# Patient Record
Sex: Female | Born: 1985 | Race: Black or African American | Hispanic: No
Health system: Southern US, Community
[De-identification: ages and names within clinical notes are randomized; demographics above are authoritative.]

## PROBLEM LIST (undated history)

## (undated) DIAGNOSIS — Z6830 Body mass index (BMI) 30.0-30.9, adult: Secondary | ICD-10-CM

## (undated) DIAGNOSIS — Z5900 Homelessness unspecified: Secondary | ICD-10-CM

## (undated) HISTORY — PX: OTHER SURGICAL HISTORY: SHX169

---

## 2013-01-03 NOTE — ED Provider Notes (Signed)
[facility]  EMERGENCY DEPARTMENT TREATMENT REPORT  NAME:  Chen, Whitney  SEX:   F  ADMIT: 01/02/2013  DOB:   04/25/1985  MR#    [id number]  ROOM:    TIME DICTATED: 04 15 AM  ACCT#  [id number]        CHIEF COMPLAINT:  Flu-like illness.    HISTORY OF PRESENT ILLNESS:  A 28 year old female who arrived to the Emergency Department, states that for   the last couple of days she has been having a scratchy throat, fever, body   aches, mild cough.  No runny nose.  No ear pain.  Noticed that the right side   ear hurts, but her throat hurts as well.  She is able to swallow but is   irritating.  No drooling.  No change of her voice.    REVIEW OF SYSTEMS:  CONSTITUTIONAL:  Fever, sore throat.    RESPIRATORY:  No cough, shortness of breath, or wheezing.  CARDIOVASCULAR:  No chest pain, chest pressure, or palpitations.  GASTROINTESTINAL:  No vomiting, diarrhea, or abdominal pain.  GENITOURINARY:  No dysuria, frequency, or urgency.  MUSCULOSKELETAL:  No joint pain or swelling.  Body aches.  INTEGUMENTARY:  No rashes.  NEUROLOGIC:  No headaches.    PAST MEDICAL HISTORY:  Obesity, tobacco dependency.    FAMILY HISTORY:  Hypertension.    SOCIAL HISTORY:  Drinks and smokes.    ALLERGIES:  SEE IBEX.    MEDICATIONS:  See Ibex.    PHYSICAL EXAMINATION:  VITAL SIGNS:  131/70, 98, 18, 99, 101.6, on 0 to 10 pain scale 7 out of 10,   saturation 98%.  GENERAL APPEARANCE:  Patient appears well developed and well nourished.    Appearance and behavior are age and situation appropriate.    HEENT:  Normocephalic, atraumatic.  NECK:  Supple, nontender, symmetrical, no masses or JVD, trachea midline,   thyroid not enlarged, nodular or tender.  RESPIRATORY:  Clear and equal breath sounds.  No respiratory distress,   tachypnea, or accessory muscle use.    CARDIOVASCULAR:  Heart regular rate and rhythm without any rubs, murmurs,   gallops or thrills.  GASTROINTESTINAL:  Abdomen soft, nontender, without complaint of pain to    palpation.  No hepatomegaly or splenomegaly.  SKIN:  Warm and dry without rashes.  NEUROLOGIC:  Alert, oriented.  Sensation intact, motor strength equal and   symmetric.  No nuchal rigidity.  ENT:  Ears/Nose:  Hearing is grossly intact to voice.  Internal and external   examinations of the ears and nose are unremarkable.  :  Warm and dry without   rashes.  The tonsils are both erythematous, swollen with some exudate.  No   posterior lymphadenopathy palpable.  Does have bilateral cervical   lymphadenopathy palpable.  No trismus, no PTA, no uvular deviation.    ASSESSMENT AND MANAGEMENT PLAN:  This is a 28 year old female who comes in with what appears to be tonsillitis.    She has no peritonsillar abscess, no retropharyngeal abscess.  We will go   ahead and give her the first dose of penicillin and prednisone and send her   home with penicillin, prednisone, Motrin and  Norco.  I do not think that she   has the flu.  At this present time the patient is going home.    FINAL DIAGNOSIS:  Acute tonsillitis.    The patient was personally evaluated by myself and Dr. Imogene Burn, who   agrees with the above  assessment and plan.      ___________________  Imogene Burnichard Icelynn Onken M.D.  Dictated By: Hilaria OtaNelson Santiago, PA-C    My signature above authenticates this document and my orders, the final  diagnosis (es), discharge prescription (s), and instructions in the [facility]   Pulsecheck record.  Nursing notes have been reviewed by the physician/mid-level provider.    If you have any questions please contact [phone].    MLT  D:01/03/2013 04:15:30  T: 01/03/2013 08:24:47  [id number]  Authenticated by Gerlene Burdockichard L. Pradeep Beaubrun, M.D. On 01/04/2013 11:56:36 AM

## 2013-02-16 ENCOUNTER — Emergency Department (HOSPITAL_COMMUNITY)
Admission: EM | Admit: 2013-02-16 | Discharge: 2013-02-16 | Disposition: A | Discharge status: Home / Self Care | Payer: Medicaid - Out of State | Attending: Emergency Medicine | Admitting: Emergency Medicine

## 2013-02-16 ENCOUNTER — Encounter (HOSPITAL_COMMUNITY): Payer: Self-pay | Admitting: Emergency Medicine

## 2013-02-16 DIAGNOSIS — R599 Enlarged lymph nodes, unspecified: Secondary | ICD-10-CM | POA: Insufficient documentation

## 2013-02-16 DIAGNOSIS — K029 Dental caries, unspecified: Secondary | ICD-10-CM

## 2013-02-16 DIAGNOSIS — K0889 Other specified disorders of teeth and supporting structures: Secondary | ICD-10-CM

## 2013-02-16 DIAGNOSIS — F17200 Nicotine dependence, unspecified, uncomplicated: Secondary | ICD-10-CM | POA: Insufficient documentation

## 2013-02-16 DIAGNOSIS — K089 Disorder of teeth and supporting structures, unspecified: Secondary | ICD-10-CM | POA: Insufficient documentation

## 2013-02-16 MED ORDER — PENICILLIN V POTASSIUM 250 MG PO TABS: 500.0000 mg | ORAL_TABLET | Freq: Three times a day (TID) | ORAL | Status: AC

## 2013-02-16 MED ORDER — OXYCODONE-ACETAMINOPHEN 5-325 MG PO TABS: 2.0000 | ORAL_TABLET | ORAL | Status: DC | PRN

## 2013-02-16 NOTE — ED Provider Notes (Signed)
CSN: [id number]     Arrival date & time 02/16/13  1012 History  This chart was scribed for Elpidio Anis, non-physician practitioner, working with Rolland Porter, MD, by Ellin Mayhew, ED Scribe. This patient was seen in room TR07C/TR07C and the patient's care was started at 10:33 AM.   Chief Complaint  Patient presents with  . Dental Pain   Patient is a 28 y.o. female presenting with tooth pain. The history is provided by the patient. No language interpreter was used.  Dental Pain Associated symptoms: no fever    HPI Comments: Lisa Blevins is a 28 y.o. female who presents to the Emergency Department complaining of upper R dental pain with onset last night. She reports the tooth chipped a long while ago, however, that it finished breaking yesterday, after which she has been having severe pain. Patient characterizes the pain as a dull, aching pain. Patient just moved to [facility] and does not currently have a Education officer, community  or insurance. Patient denies any fever. She is a current smoker. She denies any allergies.   No past medical history on file. No past surgical history on file. No family history on file. History  Substance Use Topics  . Smoking status: Not on file  . Smokeless tobacco: Not on file  . Alcohol Use: Not on file   OB History   No data available     Review of Systems  Constitutional: Negative for fever and chills.  HENT: Positive for dental problem.   Respiratory: Negative for shortness of breath.   Gastrointestinal: Negative for nausea and vomiting.  Neurological: Negative for weakness.  All other systems reviewed and are negative.   Allergies  Review of patient's allergies indicates not on file.  Home Medications  No current outpatient prescriptions on file.  BP 141/84  Pulse 79  Temp(Src) 97 F (36.1 C) (Oral)  Resp 16  Ht 5\' 11"  (1.803 m)  Wt 210 lb (95.255 kg)  BMI 29.30 kg/m2  SpO2 100%  Physical Exam  Nursing note and vitals reviewed. Constitutional:  She is oriented to person, place, and time. She appears well-developed and well-nourished. No distress.  HENT:  Head: Normocephalic and atraumatic.  Mouth/Throat: Oropharynx is clear and moist. No dental abscesses.  Generally good dentition. Severe decay to tooth number 4 on upper R side. No obvious or visualized pointing abscess to gingiva.  Eyes: EOM are normal.  Neck: Neck supple. No tracheal deviation present.  Cardiovascular: Normal rate.   Pulmonary/Chest: Effort normal. No respiratory distress.  Musculoskeletal: Normal range of motion.  Lymphadenopathy:    She has cervical adenopathy (R cervical adenopathy).  Neurological: She is alert and oriented to person, place, and time.  Skin: Skin is warm and dry.  Psychiatric: She has a normal mood and affect. Her behavior is normal.    ED Course  Procedures (including critical care time)  DIAGNOSTIC STUDIES: Oxygen Saturation is 100% on room air, normal by my interpretation.    COORDINATION OF CARE: 10:35 AM-Recommended patient to contact a dentist and advised her to quit smoking.  Treatment plan discussed with patient and patient agrees.  Labs Review Labs Reviewed - No data to display Imaging Review No results found.  EKG Interpretation   None       MDM   Final diagnoses:  None    1. Dental pain  Uncomplicated dental pain due to decay.  I personally performed the services described in this documentation, which was scribed in my presence. The recorded information  has been reviewed and is accurate.     Arnoldo HookerShari A Trevian Hayashida, PA-C 02/16/13 1505

## 2013-02-16 NOTE — Discharge Instructions (Signed)
Dental Caries Dental caries is tooth decay. This decay can cause a hole in teeth (cavity) that can get bigger and deeper over time. HOME CARE  Brush and floss your teeth. Do this at least two times a day.  Use a fluoride toothpaste.  Use a mouth rinse if told by your dentist or doctor.  Eat less sugary and starchy foods. Drink less sugary drinks.  Avoid snacking often on sugary and starchy foods. Avoid sipping often on sugary drinks.  Keep regular checkups and cleanings with your dentist.  Use fluoride supplements if told by your dentist or doctor.  Allow fluoride to be applied to teeth if told by your dentist or doctor. MAKE SURE YOU:  Understand these instructions.  Will watch your condition.  Will get help right away if you are not doing well or get worse. Document Released: 09/28/2007 Document Revised: 08/21/2012 Document Reviewed: 12/22/2011 [facility] Patient Information 2014 [place], [place].  Dental Care and Dentist Visits Dental care supports good overall health. Regular dental visits can also help you avoid dental pain, bleeding, infection, and other more serious health problems in the future. It is important to keep the mouth healthy because diseases in the teeth, gums, and other oral tissues can spread to other areas of the body. Some problems, such as diabetes, heart disease, and pre-term labor have been associated with poor oral health.  See your dentist every 6 months. If you experience emergency problems such as a toothache or broken tooth, go to the dentist right away. If you see your dentist regularly, you may catch problems early. It is easier to be treated for problems in the early stages.  WHAT TO EXPECT AT A DENTIST VISIT  Your dentist will look for many common oral health problems and recommend proper treatment. At your regular dental visit, you can expect:  Gentle cleaning of the teeth and gums. This includes scraping and polishing. This helps to remove the  sticky substance around the teeth and gums (plaque). Plaque forms in the mouth shortly after eating. Over time, plaque hardens on the teeth as tartar. If tartar is not removed regularly, it can cause problems. Cleaning also helps remove stains.  Periodic X-rays. These pictures of the teeth and supporting bone will help your dentist assess the health of your teeth.  Periodic fluoride treatments. Fluoride is a natural mineral shown to help strengthen teeth. Fluoride treatmentinvolves applying a fluoride gel or varnish to the teeth. It is most commonly done in children.  Examination of the mouth, tongue, jaws, teeth, and gums to look for any oral health problems, such as:  Cavities (dental caries). This is decay on the tooth caused by plaque, sugar, and acid in the mouth. It is best to catch a cavity when it is small.  Inflammation of the gums caused by plaque buildup (gingivitis).  Problems with the mouth or malformed or misaligned teeth.  Oral cancer or other diseases of the soft tissues or jaws. KEEP YOUR TEETH AND GUMS HEALTHY For healthy teeth and gums, follow these general guidelines as well as your dentist's specific advice:  Have your teeth professionally cleaned at the dentist every 6 months.  Brush twice daily with a fluoride toothpaste.  Floss your teeth daily.  Ask your dentist if you need fluoride supplements, treatments, or fluoride toothpaste.  Eat a healthy diet. Reduce foods and drinks with added sugar.  Avoid smoking. TREATMENT FOR ORAL HEALTH PROBLEMS If you have oral health problems, treatment varies depending on the conditions  present in your teeth and gums. °· Your caregiver will most likely recommend good oral hygiene at each visit. °· For cavities, gingivitis, or other oral health disease, your caregiver will perform a procedure to treat the problem. This is typically done at a separate appointment. Sometimes your caregiver will refer you to another dental  specialist for specific tooth problems or for surgery. °SEEK IMMEDIATE DENTAL CARE IF: °· You have pain, bleeding, or soreness in the gum, tooth, jaw, or mouth area. °· A permanent tooth becomes loose or separated from the gum socket. °· You experience a blow or injury to the mouth or jaw area. °Document Released: 08/31/2010 Document Revised: 03/13/2011 Document Reviewed: 08/31/2010 °[employer]® Patient Information ©2014 [employer]. ° °

## 2013-02-16 NOTE — ED Notes (Signed)
Pt reports one of her teeth chipped off this am and shes having severe pain since. Just moved to [place] and does not have a Education officer, communitydentist or insurance

## 2013-02-20 NOTE — ED Provider Notes (Signed)
Medical screening examination/treatment/procedure(s) were performed by non-physician practitioner and as supervising physician I was immediately available for consultation/collaboration.  EKG Interpretation   None         Hakan Nudelman, MD 02/20/13 0839 

## 2013-10-10 ENCOUNTER — Encounter (HOSPITAL_COMMUNITY): Payer: Self-pay | Admitting: Emergency Medicine

## 2013-10-10 ENCOUNTER — Emergency Department (HOSPITAL_COMMUNITY)
Admission: EM | Admit: 2013-10-10 | Discharge: 2013-10-10 | Disposition: A | Discharge status: Home / Self Care | Payer: Medicaid - Out of State | Attending: Emergency Medicine | Admitting: Emergency Medicine

## 2013-10-10 DIAGNOSIS — R51 Headache: Secondary | ICD-10-CM | POA: Insufficient documentation

## 2013-10-10 DIAGNOSIS — K088 Other specified disorders of teeth and supporting structures: Secondary | ICD-10-CM | POA: Diagnosis not present

## 2013-10-10 DIAGNOSIS — Z720 Tobacco use: Secondary | ICD-10-CM | POA: Diagnosis not present

## 2013-10-10 DIAGNOSIS — H9209 Otalgia, unspecified ear: Secondary | ICD-10-CM | POA: Diagnosis present

## 2013-10-10 DIAGNOSIS — K0889 Other specified disorders of teeth and supporting structures: Secondary | ICD-10-CM

## 2013-10-10 MED ORDER — HYDROCODONE-ACETAMINOPHEN 5-325 MG PO TABS: 1.0000 | ORAL_TABLET | Freq: Four times a day (QID) | ORAL | Status: DC | PRN

## 2013-10-10 MED ORDER — PENICILLIN V POTASSIUM 250 MG PO TABS: 250.0000 mg | ORAL_TABLET | Freq: Four times a day (QID) | ORAL | Status: DC

## 2013-10-10 NOTE — Discharge Instructions (Signed)

## 2013-10-10 NOTE — ED Notes (Signed)
Pt c/o facial pain and otalgia since this morning.

## 2013-10-10 NOTE — ED Provider Notes (Signed)
Medical screening examination/treatment/procedure(s) were performed by non-physician practitioner and as supervising physician I was immediately available for consultation/collaboration.  Refugia Laneve L Tiombe Tomeo, MD 10/10/13 1527 

## 2013-10-10 NOTE — ED Provider Notes (Signed)
CSN: [id number]     Arrival date & time 10/10/13  1108 History   First MD Initiated Contact with Patient 10/10/13 1118     Chief Complaint  Patient presents with  . Facial Pain  . Otalgia     (Consider location/radiation/quality/duration/timing/severity/associated sxs/prior Treatment) HPI Comments: Pt c/o left facial pain, dental pain and toothache that started this morning. Denies fever or swelling. Has been taking ibuprofen without relief. States that she has known dental problem but she is not sure this is related. Denies any recent cold symptoms  The history is provided by the patient. No language interpreter was used.    History reviewed. No pertinent past medical history. No past surgical history on file. No family history on file. History  Substance Use Topics  . Smoking status: Current Every Day Smoker    Types: Cigarettes  . Smokeless tobacco: Not on file  . Alcohol Use: No   OB History   Grav Para Term Preterm Abortions TAB SAB Ect Mult Living                 Review of Systems  Constitutional: Negative.   HENT: Positive for dental problem.   Respiratory: Negative.   Cardiovascular: Negative.       Allergies  Review of patient's allergies indicates no known allergies.  Home Medications   Prior to Admission medications   Medication Sig Start Date End Date Taking? Authorizing Provider  HYDROcodone-acetaminophen (NORCO/VICODIN) 5-325 MG per tablet Take 1-2 tablets by mouth every 6 (six) hours as needed. 10/10/13   Teressa LowerVrinda Aleksi Brummet, NP  oxyCODONE-acetaminophen (PERCOCET/ROXICET) 5-325 MG per tablet Take 2 tablets by mouth every 4 (four) hours as needed for severe pain. 02/16/13   Shari A Upstill, PA-C  penicillin v potassium (VEETID) 250 MG tablet Take 1 tablet (250 mg total) by mouth 4 (four) times daily. 10/10/13 10/17/13  Teressa LowerVrinda Abhinav Mayorquin, NP   BP 143/88  Pulse 70  Temp(Src) 98.5 F (36.9 C) (Oral)  Resp 18  SpO2 100% Physical Exam  Nursing note and  vitals reviewed. Constitutional: She appears well-developed and well-nourished.  HENT:  Right Ear: External ear normal.  Left Ear: External ear normal.  Mouth/Throat:    Dental decay and cracked teeth noted  Cardiovascular: Normal rate and regular rhythm.   Pulmonary/Chest: Effort normal and breath sounds normal.  Musculoskeletal: Normal range of motion.    ED Course  Procedures (including critical care time) Labs Review Labs Reviewed - No data to display  Imaging Review No results found.   EKG Interpretation None      MDM   Final diagnoses:  Pain, dental    Will treat with antibiotics and pain medication    Teressa LowerVrinda Daneya Hartgrove, NP 10/10/13 1135

## 2013-10-11 ENCOUNTER — Emergency Department (HOSPITAL_COMMUNITY)
Admission: EM | Admit: 2013-10-11 | Discharge: 2013-10-11 | Disposition: A | Discharge status: Home / Self Care | Payer: Medicaid - Out of State | Attending: Emergency Medicine | Admitting: Emergency Medicine

## 2013-10-11 ENCOUNTER — Encounter (HOSPITAL_COMMUNITY): Payer: Self-pay | Admitting: Emergency Medicine

## 2013-10-11 DIAGNOSIS — K029 Dental caries, unspecified: Secondary | ICD-10-CM | POA: Diagnosis not present

## 2013-10-11 DIAGNOSIS — Z720 Tobacco use: Secondary | ICD-10-CM | POA: Diagnosis not present

## 2013-10-11 DIAGNOSIS — K088 Other specified disorders of teeth and supporting structures: Secondary | ICD-10-CM | POA: Diagnosis present

## 2013-10-11 DIAGNOSIS — K006 Disturbances in tooth eruption: Secondary | ICD-10-CM | POA: Diagnosis not present

## 2013-10-11 MED ORDER — HYDROMORPHONE HCL 1 MG/ML IJ SOLN
1.0000 mg | Freq: Once | INTRAMUSCULAR | Status: AC
  Administered 2013-10-11: 1 mg via INTRAMUSCULAR
  Filled 2013-10-11: qty 1

## 2013-10-11 MED ORDER — OXYCODONE-ACETAMINOPHEN 5-325 MG PO TABS: 2.0000 | ORAL_TABLET | ORAL | Status: DC | PRN

## 2013-10-11 NOTE — Discharge Instructions (Signed)
Increase your hydrocodone to 2 tabs as needed.   If that doesn't work, take percocet for pain.   Take motrin 800 mg every 6 hrs for pain.   Please call the dentist on Monday for follow up.   Return to ER if you have fever, severe pain, trouble swallowing, worse swelling.

## 2013-10-11 NOTE — ED Notes (Signed)
Pt reports she did not drive, a friend dropped her off at ED.

## 2013-10-11 NOTE — ED Notes (Signed)
Pt. Reports progressing left upper molar pain onset yesterday unrelieved by OTC Ibuprofen .

## 2013-10-11 NOTE — ED Provider Notes (Addendum)
CSN: [id number]     Arrival date & time 10/11/13  2053 History   First MD Initiated Contact with Patient 10/11/13 2140     Chief Complaint  Patient presents with  . Dental Pain     (Consider location/radiation/quality/duration/timing/severity/associated sxs/prior Treatment) The history is provided by the patient.  Lisa Blevins is a 28 y.o. female hx of poor dentition here with L upper molar pain. She has been having pain since yesterday. [facility]. She was prescribed Pen VK and norco. However, she states that her took still hurts despite the norco. She states that her left face is more tender and painful. Denies fevers. Denies trouble swallowing.    History reviewed. No pertinent past medical history. Past Surgical History  Procedure Laterality Date  . Arm surgery     No family history on file. History  Substance Use Topics  . Smoking status: Current Every Day Smoker    Types: Cigarettes  . Smokeless tobacco: Not on file  . Alcohol Use: No   OB History   Grav Para Term Preterm Abortions TAB SAB Ect Mult Living                 Review of Systems  HENT: Positive for dental problem.   All other systems reviewed and are negative.     Allergies  Review of patient's allergies indicates no known allergies.  Home Medications   Prior to Admission medications   Medication Sig Start Date End Date Taking? Authorizing Provider  HYDROcodone-acetaminophen (NORCO/VICODIN) 5-325 MG per tablet Take 1-2 tablets by mouth every 6 (six) hours as needed. 10/10/13   Teressa LowerVrinda Pickering, NP  oxyCODONE-acetaminophen (PERCOCET/ROXICET) 5-325 MG per tablet Take 2 tablets by mouth every 4 (four) hours as needed for severe pain. 02/16/13   Shari A Upstill, PA-C  penicillin v potassium (VEETID) 250 MG tablet Take 1 tablet (250 mg total) by mouth 4 (four) times daily. 10/10/13 10/17/13  Teressa LowerVrinda Pickering, NP   BP 136/78  Pulse 86  Temp(Src) 98.5 F (36.9 C) (Oral)   Resp 18  Ht 5\' 11"  (1.803 m)  Wt 200 lb (90.719 kg)  BMI 27.91 kg/m2  SpO2 100%  LMP 09/24/2013 Physical Exam  Nursing note and vitals reviewed. Constitutional: She is oriented to person, place, and time.  Uncomfortable, eyes closed   HENT:  Head: Normocephalic.  L upper molar with poor dentition. Some swelling around the gingiva but no obvious peri apical abscess. Minimal L facial swelling. Bilateral TMs nl. No base of tongue swelling or firmness.   Eyes: Conjunctivae are normal. Pupils are equal, round, and reactive to light.  Neck: Normal range of motion. Neck supple.  No cervical LAD   Cardiovascular: Normal rate.   Pulmonary/Chest: Effort normal.  Abdominal: Soft.  Musculoskeletal: Normal range of motion.  Neurological: She is alert and oriented to person, place, and time.  Skin: Skin is warm and dry.  Psychiatric: She has a normal mood and affect. Her behavior is normal. Judgment and thought content normal.    ED Course  Procedures (including critical care time)  EMERGENCY DEPARTMENT US SOFT TISSUE INTERPRETATION "Study: Limited Ultrasound of the noted body part in comments below"  INDICATIONS: Pain Multiple views of the body part are obtained with a multi-frequency linear probe  PERFORMED BY:  Myself  IMAGES ARCHIVED?: No  SIDE:Left  BODY PART:Other soft tisse (comment in note)  FINDINGS: No abcess noted  LIMITATIONS:  Emergent Procedure  INTERPRETATION:  No abcess noted  COMMENT:  L facial US done. No obvious periapical abscess. No cellulitis     Labs Review Labs Reviewed - No data to display  Imaging Review No results found.   EKG Interpretation None      MDM   Final diagnoses:  None    Lisa Blevins is a 28 y.o. female here with L upper molar pain. She has no evidence of ludwig's angina. No signs of periapical abscess. Vitals stable. I reassured her and told her to take 2 hydrocodones instead of 1 and call dentist for follow up.     Richardean Canalavid H Yao, MD 10/11/13 2152  Richardean Canalavid H Yao, MD 10/11/13 2159

## 2013-10-16 ENCOUNTER — Encounter (HOSPITAL_COMMUNITY): Payer: Self-pay | Admitting: Emergency Medicine

## 2013-10-16 ENCOUNTER — Emergency Department (HOSPITAL_COMMUNITY)
Admission: EM | Admit: 2013-10-16 | Discharge: 2013-10-16 | Disposition: A | Discharge status: Home / Self Care | Payer: Medicaid - Out of State | Attending: Emergency Medicine | Admitting: Emergency Medicine

## 2013-10-16 DIAGNOSIS — Z791 Long term (current) use of non-steroidal anti-inflammatories (NSAID): Secondary | ICD-10-CM | POA: Diagnosis not present

## 2013-10-16 DIAGNOSIS — J029 Acute pharyngitis, unspecified: Secondary | ICD-10-CM | POA: Diagnosis present

## 2013-10-16 DIAGNOSIS — H9209 Otalgia, unspecified ear: Secondary | ICD-10-CM | POA: Insufficient documentation

## 2013-10-16 DIAGNOSIS — Z720 Tobacco use: Secondary | ICD-10-CM | POA: Insufficient documentation

## 2013-10-16 DIAGNOSIS — Z792 Long term (current) use of antibiotics: Secondary | ICD-10-CM | POA: Diagnosis not present

## 2013-10-16 LAB — RAPID STREP SCREEN (MED CTR MEBANE ONLY): Streptococcus, Group A Screen (Direct): NEGATIVE

## 2013-10-16 MED ORDER — KETOROLAC TROMETHAMINE 30 MG/ML IJ SOLN
30.0000 mg | Freq: Once | INTRAMUSCULAR | Status: AC
  Administered 2013-10-16: 30 mg via INTRAMUSCULAR
  Filled 2013-10-16: qty 1

## 2013-10-16 MED ORDER — LIDOCAINE VISCOUS 2 % MT SOLN
15.0000 mL | Freq: Once | OROMUCOSAL | Status: AC
  Administered 2013-10-16: 15 mL via OROMUCOSAL
  Filled 2013-10-16: qty 15

## 2013-10-16 MED ORDER — ACETAMINOPHEN 325 MG PO TABS
650.0000 mg | ORAL_TABLET | Freq: Once | ORAL | Status: AC
  Administered 2013-10-16: 650 mg via ORAL
  Filled 2013-10-16: qty 2

## 2013-10-16 NOTE — Discharge Instructions (Signed)
Continue taking Penicillin until your prescription is done

## 2013-10-16 NOTE — ED Notes (Signed)
Pt reports noticing redness to tonsils with white patches yesterday, pain with swallowing. Pt states she was recently seen here for dental pain and has been taking Penicillin. Reports chills. Pt in NAD. Airway intact.

## 2013-10-16 NOTE — ED Provider Notes (Signed)
CSN: [id number]     Arrival date & time 10/16/13  1547 History   This chart was scribed for a non-physician practitioner, Santiago GladHeather Angeliyah Kirkey, PA-C working with Suzi RootsKevin E Steinl, MD by [place] Peace, ED Scribe. [facility] The patient's care was started at 7:27 PM.    Chief Complaint  Patient presents with  . Sore Throat      Patient is a [age] y.o. female presenting with pharyngitis. The history is provided by the patient. No language interpreter was used.  Sore Throat Associated symptoms include headaches.  Sore Throat Associated symptoms include chills, diaphoresis, headaches and a sore throat. Pertinent negatives include no coughing.   HPI Comments: Lisa Blevins is a 28 y.o. female who presents to the Emergency Department complaining of sore throat with associated ear pain, chills, headache, diaphoresis, and pain with swallowing. She notes redness to tonsils along with white patches to throat. Pt states she was seen here previously for dental pain and prescribed Penicillin which she has been taking 4x daily in addition to Ibuprofen. She denies history of strep throat to her knowledge. She further denies any cough or sinus pain   She is unsure if she has had a fever.  She reports pain with swallowing, but denies difficulty swallowing.  No known sick contacts.    History reviewed. No pertinent past medical history. Past Surgical History  Procedure Laterality Date  . Arm surgery     No family history on file. History  Substance Use Topics  . Smoking status: Current Every Day Smoker -- 0.50 packs/day    Types: Cigarettes  . Smokeless tobacco: Not on file  . Alcohol Use: No   OB History   Grav Para Term Preterm Abortions TAB SAB Ect Mult Living                 Review of Systems  Constitutional: Positive for chills and diaphoresis.  HENT: Positive for ear pain and sore throat. Negative for trouble swallowing.   Respiratory: Negative for cough.   Neurological:  Positive for headaches.      Allergies  Review of patient's allergies indicates no known allergies.  Home Medications   Prior to Admission medications   Medication Sig Start Date End Date Taking? Authorizing Provider  HYDROcodone-acetaminophen (NORCO/VICODIN) 5-325 MG per tablet Take 2 tablets by mouth every 6 (six) hours as needed for moderate pain.   Yes Historical Provider, MD  ibuprofen (ADVIL,MOTRIN) 200 MG tablet Take 200 mg by mouth at bedtime as needed (pain).   Yes Historical Provider, MD  penicillin v potassium (VEETID) 250 MG tablet Take 250 mg by mouth 4 (four) times daily. Started on 10-11-13 10/10/13 10/17/13 Yes Teressa LowerVrinda Pickering, NP   BP 152/89  Pulse 86  Temp(Src) 99.5 F (37.5 C) (Oral)  Resp 18  SpO2 100%  LMP 09/24/2013 Physical Exam  Nursing note and vitals reviewed. Constitutional: She is oriented to person, place, and time. She appears well-developed and well-nourished. No distress.  HENT:  Head: Normocephalic and atraumatic.  Right Ear: Tympanic membrane, external ear and ear canal normal.  Left Ear: Tympanic membrane, external ear and ear canal normal.  Bilateral tonsilar swelling with exudates present. Tender cervical lymphadenopathy. Handling liquids well. Normal voice. Right upper gingiva swelling and tenderness. Uvula midline.   Eyes: Conjunctivae and EOM are normal.  Neck: Normal range of motion. Neck supple. No tracheal deviation present.  Cardiovascular: Normal rate, regular rhythm and normal heart sounds.   Pulmonary/Chest: Effort  normal and breath sounds normal. No respiratory distress.  Musculoskeletal: Normal range of motion.  Lymphadenopathy:    She has cervical adenopathy.  Neurological: She is alert and oriented to person, place, and time.  Skin: Skin is warm and dry.  Psychiatric: She has a normal mood and affect. Her behavior is normal.    ED Course  Procedures (including critical care time) Labs Review Labs Reviewed  RAPID STREP  SCREEN  CULTURE, GROUP A STREP    Imaging Review No results found.   EKG Interpretation None     Medications - No data to display   7:31 PM- Treatment plan was discussed with patient who verbalizes understanding and agrees.   MDM   Final diagnoses:  None   Patient presenting with sore throat.  Rapid strep negative.  Patient is currently taking PCN to treat for a dental abscess.  Instructed patient to continue to take.  No signs of a Peritonsillar abscess or Retropharyngeal Abscess.  Feel that the patient is stable for discharge.  Return precautions given.  I personally performed the services described in this documentation, which was scribed in my presence. The recorded information has been reviewed and is accurate.   Santiago GladHeather Averianna Brugger, PA-C 10/17/13 [phone]

## 2013-10-18 LAB — CULTURE, GROUP A STREP

## 2013-10-19 NOTE — ED Provider Notes (Signed)
Medical screening examination/treatment/procedure(s) were performed by non-physician practitioner and as supervising physician I was immediately available for consultation/collaboration.     Kelvin Burpee E Abdirahman Chittum, MD 10/19/13 0828 

## 2013-11-12 ENCOUNTER — Encounter (HOSPITAL_COMMUNITY): Payer: Self-pay | Admitting: Emergency Medicine

## 2013-11-12 ENCOUNTER — Emergency Department (HOSPITAL_COMMUNITY)
Admission: EM | Admit: 2013-11-12 | Discharge: 2013-11-12 | Disposition: A | Discharge status: Home / Self Care | Payer: Medicaid - Out of State | Attending: Emergency Medicine | Admitting: Emergency Medicine

## 2013-11-12 DIAGNOSIS — R112 Nausea with vomiting, unspecified: Secondary | ICD-10-CM | POA: Insufficient documentation

## 2013-11-12 DIAGNOSIS — N390 Urinary tract infection, site not specified: Secondary | ICD-10-CM | POA: Insufficient documentation

## 2013-11-12 DIAGNOSIS — Z3202 Encounter for pregnancy test, result negative: Secondary | ICD-10-CM | POA: Insufficient documentation

## 2013-11-12 DIAGNOSIS — Z720 Tobacco use: Secondary | ICD-10-CM | POA: Insufficient documentation

## 2013-11-12 DIAGNOSIS — R51 Headache: Secondary | ICD-10-CM | POA: Insufficient documentation

## 2013-11-12 DIAGNOSIS — R319 Hematuria, unspecified: Secondary | ICD-10-CM

## 2013-11-12 LAB — URINALYSIS, ROUTINE W REFLEX MICROSCOPIC
BILIRUBIN URINE: NEGATIVE
Glucose, UA: NEGATIVE mg/dL
Ketones, ur: 15 mg/dL — AB
NITRITE: NEGATIVE
Protein, ur: 30 mg/dL — AB
SPECIFIC GRAVITY, URINE: 1.015 (ref 1.005–1.030)
UROBILINOGEN UA: 0.2 mg/dL (ref 0.0–1.0)
pH: 5.5 (ref 5.0–8.0)

## 2013-11-12 LAB — BASIC METABOLIC PANEL
Anion gap: 12 (ref 5–15)
BUN: 8 mg/dL (ref 6–23)
CALCIUM: 8.9 mg/dL (ref 8.4–10.5)
CO2: 26 mEq/L (ref 19–32)
CREATININE: 0.69 mg/dL (ref 0.50–1.10)
Chloride: 103 mEq/L (ref 96–112)
Glucose, Bld: 82 mg/dL (ref 70–99)
Potassium: 4.5 mEq/L (ref 3.7–5.3)
Sodium: 141 mEq/L (ref 137–147)

## 2013-11-12 LAB — CBC WITH DIFFERENTIAL/PLATELET
BASOS PCT: 0 % (ref 0–1)
Basophils Absolute: 0 10*3/uL (ref 0.0–0.1)
EOS PCT: 6 % — AB (ref 0–5)
Eosinophils Absolute: 0.4 10*3/uL (ref 0.0–0.7)
HEMATOCRIT: 32.2 % — AB (ref 36.0–46.0)
Hemoglobin: 11 g/dL — ABNORMAL LOW (ref 12.0–15.0)
Lymphocytes Relative: 47 % — ABNORMAL HIGH (ref 12–46)
Lymphs Abs: 3.1 10*3/uL (ref 0.7–4.0)
MCH: 28.1 pg (ref 26.0–34.0)
MCHC: 34.2 g/dL (ref 30.0–36.0)
MCV: 82.4 fL (ref 78.0–100.0)
MONO ABS: 0.5 10*3/uL (ref 0.1–1.0)
Monocytes Relative: 8 % (ref 3–12)
Neutro Abs: 2.6 10*3/uL (ref 1.7–7.7)
Neutrophils Relative %: 39 % — ABNORMAL LOW (ref 43–77)
Platelets: 207 10*3/uL (ref 150–400)
RBC: 3.91 MIL/uL (ref 3.87–5.11)
RDW: 14.2 % (ref 11.5–15.5)
WBC: 6.7 10*3/uL (ref 4.0–10.5)

## 2013-11-12 LAB — WET PREP, GENITAL
Trich, Wet Prep: NONE SEEN
Yeast Wet Prep HPF POC: NONE SEEN

## 2013-11-12 LAB — PREGNANCY, URINE: PREG TEST UR: NEGATIVE

## 2013-11-12 LAB — URINE MICROSCOPIC-ADD ON

## 2013-11-12 MED ORDER — CEPHALEXIN 500 MG PO CAPS: 500.0000 mg | ORAL_CAPSULE | Freq: Three times a day (TID) | ORAL | Status: DC

## 2013-11-12 MED ORDER — KETOROLAC TROMETHAMINE 30 MG/ML IJ SOLN
30.0000 mg | Freq: Once | INTRAMUSCULAR | Status: AC
  Administered 2013-11-12: 30 mg via INTRAVENOUS
  Filled 2013-11-12: qty 1

## 2013-11-12 NOTE — ED Notes (Signed)
Pt. reports hematuria and  headache onset yesterday , denies injury , no dysuria or fever .

## 2013-11-12 NOTE — ED Provider Notes (Signed)
CSN: [id number]     Arrival date & time 11/12/13  1939 History   First MD Initiated Contact with Patient 11/12/13 1956     Chief Complaint  Patient presents with  . Hematuria     (Consider location/radiation/quality/duration/timing/severity/associated sxs/prior Treatment) Patient is a 28 y.o. female presenting with hematuria. The history is provided by the patient. No language interpreter was used.  Hematuria This is a new problem. The current episode started yesterday. The problem occurs constantly. The problem has been gradually worsening. Associated symptoms include abdominal pain (bilateral abdominal pressure with urinating), headaches, nausea and urinary symptoms. Pertinent negatives include no anorexia, arthralgias, change in bowel habit, chest pain, chills, congestion, coughing, diaphoresis, fatigue, fever, joint swelling, myalgias, neck pain, numbness, rash, sore throat, swollen glands, vertigo, visual change, vomiting or weakness. Nothing aggravates the symptoms. She has tried nothing for the symptoms. The treatment provided no relief.    History reviewed. No pertinent past medical history. Past Surgical History  Procedure Laterality Date  . Arm surgery     No family history on file. History  Substance Use Topics  . Smoking status: Current Every Day Smoker -- 0.50 packs/day    Types: Cigarettes  . Smokeless tobacco: Not on file  . Alcohol Use: No   OB History    No data available     Review of Systems  Constitutional: Negative for fever, chills, diaphoresis and fatigue.  HENT: Negative for congestion and sore throat.   Respiratory: Negative for cough.   Cardiovascular: Negative for chest pain.  Gastrointestinal: Positive for nausea and abdominal pain (bilateral abdominal pressure with urinating). Negative for vomiting, anorexia and change in bowel habit.  Genitourinary: Positive for dysuria and hematuria. Negative for frequency, flank pain and difficulty urinating.   Musculoskeletal: Negative for myalgias, joint swelling, arthralgias and neck pain.  Skin: Negative for rash.  Neurological: Positive for headaches. Negative for vertigo, weakness and numbness.  All other systems reviewed and are negative.     Allergies  Review of patient's allergies indicates no known allergies.  Home Medications   Prior to Admission medications   Medication Sig Start Date End Date Taking? Authorizing Provider  ibuprofen (ADVIL,MOTRIN) 200 MG tablet Take 800 mg by mouth every 4 (four) hours as needed for mild pain or moderate pain (pain).    Yes Historical Provider, MD  cephALEXin (KEFLEX) 500 MG capsule Take 1 capsule (500 mg total) by mouth 3 (three) times daily. 11/12/13   Makynleigh Breslin A Forcucci, PA-C  HYDROcodone-acetaminophen (NORCO/VICODIN) 5-325 MG per tablet Take 2 tablets by mouth every 6 (six) hours as needed for moderate pain.    Historical Provider, MD   BP 101/86 mmHg  Pulse 90  Temp(Src) 98.7 F (37.1 C) (Oral)  Resp 16  Ht 5\' 11"  (1.803 m)  Wt 200 lb (90.719 kg)  BMI 27.91 kg/m2  SpO2 94%  LMP 10/30/2013 Physical Exam  Constitutional: She is oriented to person, place, and time. She appears well-developed and well-nourished. No distress.  HENT:  Head: Normocephalic and atraumatic.  Mouth/Throat: Oropharynx is clear and moist. No oropharyngeal exudate.  Eyes: Conjunctivae and EOM are normal. Pupils are equal, round, and reactive to light. No scleral icterus.  Neck: Normal range of motion. Neck supple. No JVD present. No thyromegaly present.  Cardiovascular: Normal rate, regular rhythm, normal heart sounds and intact distal pulses.  Exam reveals no gallop and no friction rub.   No murmur heard. Pulmonary/Chest: Effort normal and breath sounds normal. No respiratory distress. She  has no wheezes. She has no rales. She exhibits no tenderness.  Abdominal: Soft. Normal appearance and bowel sounds are normal. She exhibits no distension and no mass. There  is no tenderness. There is no rigidity, no rebound, no guarding, no CVA tenderness, no tenderness at McBurney's point and negative Murphy's sign.  Genitourinary: Vagina normal and uterus normal. No labial fusion. There is no rash, tenderness, lesion or injury on the right labia. There is no rash, tenderness, lesion or injury on the left labia. Cervix exhibits no motion tenderness, no discharge and no friability. Right adnexum displays no mass, no tenderness and no fullness. Left adnexum displays no mass, no tenderness and no fullness.  Multiparous os  Musculoskeletal: Normal range of motion.  Lymphadenopathy:    She has no cervical adenopathy.  Neurological: She is alert and oriented to person, place, and time. She has normal strength. No cranial nerve deficit or sensory deficit. Coordination normal.  Skin: Skin is warm and dry. She is not diaphoretic.  Psychiatric: She has a normal mood and affect. Her behavior is normal. Judgment and thought content normal.  Nursing note and vitals reviewed.   ED Course  Procedures (including critical care time) Labs Review Labs Reviewed  WET PREP, GENITAL - Abnormal; Notable for the following:    Clue Cells Wet Prep HPF POC RARE (*)    WBC, Wet Prep HPF POC RARE (*)    All other components within normal limits  URINALYSIS, ROUTINE W REFLEX MICROSCOPIC - Abnormal; Notable for the following:    Color, Urine RED (*)    APPearance TURBID (*)    Hgb urine dipstick LARGE (*)    Ketones, ur 15 (*)    Protein, ur 30 (*)    Leukocytes, UA SMALL (*)    All other components within normal limits  CBC WITH DIFFERENTIAL - Abnormal; Notable for the following:    Hemoglobin 11.0 (*)    HCT 32.2 (*)    Neutrophils Relative % 39 (*)    Lymphocytes Relative 47 (*)    Eosinophils Relative 6 (*)    All other components within normal limits  URINE MICROSCOPIC-ADD ON - Abnormal; Notable for the following:    Bacteria, UA FEW (*)    All other components within normal  limits  GC/CHLAMYDIA PROBE AMP  URINE CULTURE  PREGNANCY, URINE  BASIC METABOLIC PANEL  RPR  HIV ANTIBODY (ROUTINE TESTING)   Emergency Focused Ultrasound Exam Limited retroperitoneal ultrasound of kidneys  Performed and interpreted by Terri Piedraourtney Forcucci PA-C and Dr. Hyacinth MeekerMiller Indication: Hematuria Focused abdominal ultrasound with both kidneys imaged in transverse and longitudinal planes in real-time. Interpretation: No hydronephrosis visualized.  Bladder WNL Images archived electronically Imaging Reviewed by Dr. Hyacinth MeekerMiller who agrees with the above impression  No results found.   EKG Interpretation None      MDM   Final diagnoses:  UTI (lower urinary tract infection)  Hematuria   Patient is a 28 y.o. Female who presents to the ED with hematuria and headache.  There are no red flag symptoms for headache and is like previous headaches.  Treated with toradol here with complete relief.  There are no focal neurological deficits.  There are no meningeal signs.  UA does show hematuria with possible infection.  Will send off to culture.  Kidneys observed under ultrasound with no evidence of stones or hydronephrosis.  Urine culture is pending.  Wet prep is negative.  CBC reveals mild anemia. BMP unremarkable.  Will treat like a  UTI.  Will discharge home on keflex.  Patient to follow-up with [facility].  Patient given resource list.  Patient is stable for discharge at this time.  Patient seen by and discussed with Dr. Hyacinth Meeker who agrees with the above discharge and plan.      Eben Burow, PA-C 11/12/13 2157  Vida Roller, MD 11/12/13 [phone]

## 2013-11-12 NOTE — ED Provider Notes (Signed)
The patient is a 28 year old female, history of hematuria, on exam the patient has a soft nontender abdomen, no CVA tenderness, otherwise appears well with normal heart and lung sounds. Bedside ultrasound reveals no signs of kidney stone or hydronephrosis, hematuria present on urinalysis some bacteria, likely urinary tract infection, this was described to the patient, she is aware of needing antibiotics and follow up, will send urinary culture.  Emergency Focused Ultrasound Exam Limited retroperitoneal ultrasound of kidneys  Performed and interpreted by Dr. Hyacinth MeekerMiller Indication: flank pain Focused abdominal ultrasound with both kidneys imaged in transverse and longitudinal planes in real-time. Interpretation: no hydronephrosis visualized.   Images archived electronically   Medical screening examination/treatment/procedure(s) were conducted as a shared visit with non-physician practitioner(s) and myself.  I personally evaluated the patient during the encounter.  Clinical Impression:   Final diagnoses:  UTI (lower urinary tract infection)  Hematuria         Vida RollerBrian D Elberta Lachapelle, MD 11/12/13 [phone]

## 2013-11-12 NOTE — Discharge Instructions (Signed)
Urinary Tract Infection °Urinary tract infections (UTIs) can develop anywhere along your urinary tract. Your urinary tract is your body's drainage system for removing wastes and extra water. Your urinary tract includes two kidneys, two ureters, a bladder, and a urethra. Your kidneys are a pair of bean-shaped organs. Each kidney is about the size of your fist. They are located below your ribs, one on each side of your spine. °CAUSES °Infections are caused by microbes, which are microscopic organisms, including fungi, viruses, and bacteria. These organisms are so small that they can only be seen through a microscope. Bacteria are the microbes that most commonly cause UTIs. °SYMPTOMS  °Symptoms of UTIs may vary by age and gender of the patient and by the location of the infection. Symptoms in young women typically include a frequent and intense urge to urinate and a painful, burning feeling in the bladder or urethra during urination. Older women and men are more likely to be tired, shaky, and weak and have muscle aches and abdominal pain. A fever may mean the infection is in your kidneys. Other symptoms of a kidney infection include pain in your back or sides below the ribs, nausea, and vomiting. °DIAGNOSIS °To diagnose a UTI, your caregiver will ask you about your symptoms. Your caregiver also will ask to provide a urine sample. The urine sample will be tested for bacteria and white blood cells. White blood cells are made by your body to help fight infection. °TREATMENT  °Typically, UTIs can be treated with medication. Because most UTIs are caused by a bacterial infection, they usually can be treated with the use of antibiotics. The choice of antibiotic and length of treatment depend on your symptoms and the type of bacteria causing your infection. °HOME CARE INSTRUCTIONS °· If you were prescribed antibiotics, take them exactly as your caregiver instructs you. Finish the medication even if you feel better after you  have only taken some of the medication. °· Drink enough water and fluids to keep your urine clear or pale yellow. °· Avoid caffeine, tea, and carbonated beverages. They tend to irritate your bladder. °· Empty your bladder often. Avoid holding urine for long periods of time. °· Empty your bladder before and after sexual intercourse. °· After a bowel movement, women should cleanse from front to back. Use each tissue only once. °SEEK MEDICAL CARE IF:  °· You have back pain. °· You develop a fever. °· Your symptoms do not begin to resolve within 3 days. °SEEK IMMEDIATE MEDICAL CARE IF:  °· You have severe back pain or lower abdominal pain. °· You develop chills. °· You have nausea or vomiting. °· You have continued burning or discomfort with urination. °MAKE SURE YOU:  °· Understand these instructions. °· Will watch your condition. °· Will get help right away if you are not doing well or get worse. °Document Released: 09/28/2004 Document Revised: 06/20/2011 Document Reviewed: 01/27/2011 °ExitCare® Patient Information ©2015 [employer]. This information is not intended to replace advice given to you by your health care provider. Make sure you discuss any questions you have with your health care provider. ° ° °Emergency Department Resource Guide °1) Find a Doctor and Pay Out of Pocket °Although you won't have to find out who is covered by your insurance plan, it is a good idea to ask around and get recommendations. You will then need to call the office and see if the doctor you have chosen will accept you as a new patient and what types of   options they offer for patients who are self-pay. Some doctors offer discounts or will set up payment plans for their patients who do not have insurance, but you will need to ask so you aren't surprised when you get to your appointment. ° °2) Contact Your Local Health Department °Not all health departments have doctors that can see patients for sick visits, but many do, so it is worth  a call to see if yours does. If you don't know where your local health department is, you can check in your phone book. The CDC also has a tool to help you locate your state's health department, and many state websites also have listings of all of their local health departments. ° °3) Find a Walk-in Clinic °If your illness is not likely to be very severe or complicated, you may want to try a walk in clinic. These are popping up all over the country in pharmacies, drugstores, and shopping centers. They're usually staffed by nurse practitioners or physician assistants that have been trained to treat common illnesses and complaints. They're usually fairly quick and inexpensive. However, if you have serious medical issues or chronic medical problems, these are probably not your best option. ° °No Primary Care Doctor: °- Call [facility] at  832-8000 - they can help you locate a primary care doctor that  accepts your insurance, provides certain services, etc. °- Physician Referral Service- 1-800-533-3463 ° °Chronic Pain Problems: °Organization         Address  Phone   Notes  °[facility]  (336) 297-2271 Patients need to be referred by their primary care doctor.  ° °Medication Assistance: °Organization         Address  Phone   Notes  °[place] [facility] [address] °[place], [place] [address] (336) 641-8030 --Must be a resident of [place] °-- Must have NO insurance coverage whatsoever (no Medicaid/ Medicare, etc.) °-- The pt. MUST have a primary care doctor that directs their care regularly and follows them in the community °  °[facility]  (866) 331-1348   °[employer]  (888) 892-1162   ° °Agencies that provide inexpensive medical care: °Organization         Address  Phone   Notes  °[facility]  (336) 832-8035   °[facility]    (336) 832-7272   °[facility] [address] °[place], [place]  [address] (336) 832-4777   °[facility] [address], °[place] (336) 271-4999   °[facility]    (336) 373-0678   °[facility]    (336) 272-1050   °[facility] ° [address], [place] Phone:  (336) 832-4444, Fax:  (336) 832-4440 Hours of Operation:  9 am - 6 pm, M-F.  Also accepts Medicaid/Medicare and self-pay.  °[facility] ° [address], [place] Phone: (336) 832-3150, Fax: (336) [phone]. Hours of Operation:  8:30 am - 5:30 pm, M-F.  Also accepts Medicaid and self-pay.  °[facility] [place] [address], [place] Phone: (336) 878-6027   °[facility] [address], [place], [place] (336)723-1848, Ext. 123 Mondays & Thursdays: 7-9 AM.  First 15 patients are seen on a first come, first serve basis. °  ° °Medicaid-accepting [place] Providers: ° °Organization         Address  Phone   Notes  °  [facility] [address], [place] (336) 641-2100 Also accepts self-pay patients.  °[facility] [address], [place] ° (336) 856-9996   °[facility] [address], [place] (336) 288-8857   °[facility] [address] [place] [address], [place] (336) 299-7000   °Veita Bland [address], [place]  ° (336) 373-1557 Only accepts [place] Access Medicaid patients after they have their name applied to their card.  ° °Self-Pay (no insurance) in [place]: ° °Organization         Address  Phone   Notes  °Sickle Cell Patients, [facility] [address], [place] (336) 832-1970   °[facility] [address], [place] (336) 832-4400   °[facility] ° [address], [place] (336) 992-4800   °[facility]/Dr. Osei-Bonsu ° [address] [place] [address], [place] or [address], [place] (336) 841-8500 Phone number for both [place] and [place] locations is the same.  °[facility] [address], [place] (336) 299-0000   °[facility] [address] [place] [address], [place] or [address] (336) 852-7530 °(336) 878-2260   °[facility] [address], [place] (336) 350-1642, phone; (336) 294-5005, fax Sees patients 1st and 3rd Saturday of every month.  Must not qualify for public or private insurance (i.e. Medicaid, Medicare, [place] Health Choice, Veterans' Benefits) • Household income should be no more than 200% of the poverty level •The clinic cannot treat you if you are pregnant or think you are pregnant • Sexually transmitted diseases are not treated at the clinic.  ° ° °Dental Care: °Organization         Address  Phone  Notes  °[facility] [address], [place] (336) 641-6152 Accepts children up to age 21 who are enrolled in Medicaid or [place] Health Choice; pregnant women with a Medicaid card; and children who have applied for Medicaid or [place] Health Choice, but were declined, whose parents can pay a reduced fee at time of service.  °[facility] [place]  [address], [place] (336) 641-7733 Accepts children up to age 21 who are enrolled in Medicaid or [place] Health Choice; pregnant women with a Medicaid card; and children who have applied for Medicaid or [place] Health Choice, but were declined, whose parents can pay a reduced fee at time of service.  °[facility] ° [address],  (336) 641-4533 Patients are seen by appointment only. Walk-ins are not accepted. [facility] will see patients 18 years of age and older. °Monday - Tuesday (8am-5pm) °Most Wednesdays (8:30-5pm) °$30 per visit, cash only  °[facility] ° [address], [place] (336) 641-4533  Patients are seen by appointment only. Walk-ins are not accepted. [facility] will see patients 18 years of age and older. °One Wednesday Evening (Monthly: Volunteer Based).  $30 per visit, cash only  °[facility]  (919) 537-3737 for adults; Children under age 4, call Graduate Pediatric Dentistry at ([phone]) [phone]. Children aged 4-14, please call ([phone]) [phone] to request a pediatric application. ° Dental services are provided in all areas of dental care including fillings, crowns and bridges, complete and partial   dentures, implants, gum treatment, root canals, and extractions. Preventive care is also provided. Treatment is provided to both adults and children. °Patients are selected via a lottery and there is often a waiting list. °  °[facility] [address], °[place] ° (336) 763-8833 [email or web address] °  °[facility] [address], [place], [place] (336)723-1848, Ext. 123 Second and Fourth Thursday of each month, opens at 6:30 AM; Clinic ends at 9 AM.  Patients are seen on a first-come first-served basis, and a limited number are seen during each clinic.  ° °[facility] ° [address], [place], [place] (336) 723-7904   Eligibility Requirements °You must have lived in [place], [place], or [place] counties for at least the last three months. °  You cannot be eligible for state or federal sponsored healthcare insurance, including Veterans Administration, Medicaid, or Medicare. °  You generally cannot be eligible for healthcare insurance through your employer.  °  How to apply: °Eligibility screenings are held every Tuesday and Wednesday afternoon from 1:00 pm until 4:00 pm. You do not need an appointment for the interview!  °[facility] [address], [place], [place] 336-631-2330   °[facility]  336-342-8273   °[facility]  336-703-3100   °[facility]   336-570-6415   ° °Behavioral Health Resources in the Community: °Intensive Outpatient Programs °Organization         Address  Phone  Notes  °[facility] [address], [place], [place] 336-878-6098   °[facility] [address], [place], [place] 336-832-9800   °[facility] [address], [place], [place] ° 336-882-2125   °[facility] [address],  °[place], [place] 1-800-853-5163 or 336-641-4981   °Substance Abuse Resources °Organization         Address  Phone  Notes  °[facility]  336-882-2125   °[facility]  336-784-9470   °[facility]  336-285-9073   °[facility]  336-845-3988   °Residential & Outpatient Substance Abuse Program  1-800-659-3381   °Psychological Services °Organization         Address  Phone  Notes  °[facility]  336- 832-9600   °[facility]  336- 378-7881   °[facility] [address], [place] 1-800-853-5163 or 336-641-4981   ° °Mobile Crisis Teams °Organization         Address  Phone  Notes  °[facility], Mobile Crisis Care Unit  1-877-626-1772   °Assertive °[facility] ° [address] [place], [place] 336-834-9664   °Sharon DeEsch [address] °[place] 336-554-5454   ° °Self-Help/Support Groups °Organization         Address  Phone             Notes  °[facility] - variety of support groups  336- 373-1402 Call for more information  °Narcotics Anonymous (NA), [facility] [address], °[place]  2 meetings at this location  ° °Residential Treatment Programs °Organization         Address  Phone  Notes  °[facility] [address],    °[place]  1-866-801-8205   °[facility] ° [address], [place], [place] 704-293-8524   °[facility] [address], [place] 336-845-3988 Admissions: 8am-3pm M-F   °  [facility] [address]    °[place], [place] 336-841-1104   °[facility] [address], [place], [place] 336-379-7146   °[facility] [address]  °[place], [place] 336-285-9073   °[facility] - Intensive Outpatient [address], [place], [place] 336-852-3033   °[facility] [address]  °[place], [place] 1-877-615-2722 or 336-784-9470   °[facility] [address] [place], [place] 336-227-7417 Accepts Medicaid  °[facility] [address]  °[place] 1-800-659-3381 Substance Abuse/Addiction Treatment  ° °[place] Behavioral Health Resources °Organization         Address  Phone  Notes  °[facility]  (888) 581-9988   °Julie Brannon, PhD [address] [place], [place]   (336) 349-5553 or (336) 951-0000   °[facility]   [address] °[place], [place] (336) 349-4454   °[facility] [address], [place], [place] (336) 342-8316 Insurance/Medicaid/sponsorship through [facility]  °[facility] [address]                                    [place], [place] (336) 342-8316 Therapy/tele-psych/case  °[facility] [address]  ° [place], [place] (336) 349-2233    °Dr. Arfeen  (336) 349-4544   °[facility] [place]  [employer] [place] [facility] 1) [address], [place] °2) [address], [place] °3)  [address], [place] (336) 349-3220 °(336) 342-7768 ° °(336) 342-8140   °[place] [facility] (336) 342-1394 or (336) 342-3537 (After Hours)    ° ° ° ° °

## 2013-11-13 LAB — RPR

## 2013-11-13 LAB — GC/CHLAMYDIA PROBE AMP
CT Probe RNA: NEGATIVE
GC Probe RNA: NEGATIVE

## 2013-11-13 LAB — HIV ANTIBODY (ROUTINE TESTING W REFLEX): HIV: NONREACTIVE

## 2013-11-14 LAB — URINE CULTURE
CULTURE: NO GROWTH
Colony Count: NO GROWTH

## 2015-01-24 ENCOUNTER — Emergency Department (HOSPITAL_COMMUNITY): Payer: Medicaid Other

## 2015-01-24 ENCOUNTER — Encounter (HOSPITAL_COMMUNITY): Payer: Self-pay | Admitting: Emergency Medicine

## 2015-01-24 ENCOUNTER — Emergency Department (HOSPITAL_COMMUNITY)
Admission: EM | Admit: 2015-01-24 | Discharge: 2015-01-24 | Disposition: A | Discharge status: Home / Self Care | Payer: Medicaid Other | Attending: Emergency Medicine | Admitting: Emergency Medicine

## 2015-01-24 DIAGNOSIS — Z3202 Encounter for pregnancy test, result negative: Secondary | ICD-10-CM | POA: Diagnosis not present

## 2015-01-24 DIAGNOSIS — S0990XA Unspecified injury of head, initial encounter: Secondary | ICD-10-CM | POA: Diagnosis not present

## 2015-01-24 DIAGNOSIS — S022XXA Fracture of nasal bones, initial encounter for closed fracture: Secondary | ICD-10-CM | POA: Diagnosis not present

## 2015-01-24 DIAGNOSIS — Y9289 Other specified places as the place of occurrence of the external cause: Secondary | ICD-10-CM | POA: Insufficient documentation

## 2015-01-24 DIAGNOSIS — Y9389 Activity, other specified: Secondary | ICD-10-CM | POA: Insufficient documentation

## 2015-01-24 DIAGNOSIS — S0993XA Unspecified injury of face, initial encounter: Secondary | ICD-10-CM | POA: Diagnosis present

## 2015-01-24 DIAGNOSIS — Y998 Other external cause status: Secondary | ICD-10-CM | POA: Insufficient documentation

## 2015-01-24 DIAGNOSIS — W01198A Fall on same level from slipping, tripping and stumbling with subsequent striking against other object, initial encounter: Secondary | ICD-10-CM | POA: Diagnosis not present

## 2015-01-24 DIAGNOSIS — F17210 Nicotine dependence, cigarettes, uncomplicated: Secondary | ICD-10-CM | POA: Insufficient documentation

## 2015-01-24 LAB — CBC WITH DIFFERENTIAL/PLATELET
BASOS ABS: 0 10*3/uL (ref 0.0–0.1)
Basophils Relative: 0 %
EOS PCT: 4 %
Eosinophils Absolute: 0.2 10*3/uL (ref 0.0–0.7)
HCT: 38.4 % (ref 36.0–46.0)
Hemoglobin: 12.5 g/dL (ref 12.0–15.0)
LYMPHS PCT: 44 %
Lymphs Abs: 2.8 10*3/uL (ref 0.7–4.0)
MCH: 28.6 pg (ref 26.0–34.0)
MCHC: 32.6 g/dL (ref 30.0–36.0)
MCV: 87.9 fL (ref 78.0–100.0)
MONO ABS: 0.5 10*3/uL (ref 0.1–1.0)
Monocytes Relative: 8 %
NEUTROS ABS: 2.8 10*3/uL (ref 1.7–7.7)
Neutrophils Relative %: 44 %
Platelets: 234 10*3/uL (ref 150–400)
RBC: 4.37 MIL/uL (ref 3.87–5.11)
RDW: 13.1 % (ref 11.5–15.5)
WBC: 6.4 10*3/uL (ref 4.0–10.5)

## 2015-01-24 LAB — I-STAT BETA HCG BLOOD, ED (MC, WL, AP ONLY): I-stat hCG, quantitative: <5 m[IU]/mL (ref <5)

## 2015-01-24 LAB — BASIC METABOLIC PANEL
Anion gap: 10 (ref 5–15)
BUN: 12 mg/dL (ref 6–20)
CALCIUM: 9.3 mg/dL (ref 8.9–10.3)
CO2: 24 mmol/L (ref 22–32)
CREATININE: 1.08 mg/dL — AB (ref 0.44–1.00)
Chloride: 108 mmol/L (ref 101–111)
GFR calc Af Amer: >60 mL/min (ref >60)
GFR calc non Af Amer: >60 mL/min (ref >60)
GLUCOSE: 96 mg/dL (ref 65–99)
Potassium: 3.7 mmol/L (ref 3.5–5.1)
Sodium: 142 mmol/L (ref 135–145)

## 2015-01-24 LAB — PROTIME-INR
INR: 1.04 (ref 0.00–1.49)
Prothrombin Time: 13.8 seconds (ref 11.6–15.2)

## 2015-01-24 MED ORDER — IBUPROFEN 800 MG PO TABS: 800.0000 mg | ORAL_TABLET | Freq: Three times a day (TID) | ORAL | Status: DC

## 2015-01-24 NOTE — ED Provider Notes (Signed)
CSN: [id number]     Arrival date & time 01/24/15  1441 History   First MD Initiated Contact with Patient 01/24/15 1750     Chief Complaint  Patient presents with  . Facial Injury     (Consider location/radiation/quality/duration/timing/severity/associated sxs/prior Treatment) HPI  REcent death in the family, had too much to drink last night - fell and struck her head on something hard. But doesn't remember - she has swelling to frontal region of the head - she has some pooling aroudn the eyes - she has some burniing around teh eyes and feels weak.  She has mild neck ttp.  Sx are constant, worse with movement, no n/v.  She states fall was last night.  History reviewed. No pertinent past medical history. Past Surgical History  Procedure Laterality Date  . Arm surgery     No family history on file. Social History  Substance Use Topics  . Smoking status: Current Every Day Smoker -- 0.50 packs/day    Types: Cigarettes  . Smokeless tobacco: None  . Alcohol Use: No   OB History    No data available     Review of Systems  All other systems reviewed and are negative.     Allergies  Review of patient's allergies indicates no known allergies.  Home Medications   Prior to Admission medications   Medication Sig Start Date End Date Taking? Authorizing Provider  ibuprofen (ADVIL,MOTRIN) 800 MG tablet Take 1 tablet (800 mg total) by mouth 3 (three) times daily. 01/24/15   Eber Hong, MD   BP 128/82 mmHg  Pulse 60  Temp(Src) 98.3 F (36.8 C) (Oral)  Resp 19  SpO2 100%  LMP 12/26/2014 Physical Exam  Constitutional: She appears well-developed and well-nourished. No distress.  HENT:  Head: Normocephalic.  Mouth/Throat: Oropharynx is clear and moist. No oropharyngeal exudate.  [place] eyes is present - no battle's sign, no hemotympanum, no mallocllusion  Eyes: Conjunctivae and EOM are normal. Pupils are equal, round, and reactive to light. Right eye exhibits no discharge. Left  eye exhibits no discharge. No scleral icterus.  Neck: Normal range of motion. Neck supple. No JVD present. No thyromegaly present.  Cardiovascular: Normal rate, regular rhythm, normal heart sounds and intact distal pulses.  Exam reveals no gallop and no friction rub.   No murmur heard. Pulmonary/Chest: Effort normal and breath sounds normal. No respiratory distress. She has no wheezes. She has no rales.  Abdominal: Soft. Bowel sounds are normal. She exhibits no distension and no mass. There is no tenderness.  Musculoskeletal: Normal range of motion. She exhibits no edema or tenderness.  Lymphadenopathy:    She has no cervical adenopathy.  Neurological: She is alert. Coordination normal.  Neurologic exam:  Speech clear, pupils equal round reactive to light, extraocular movements intact  Normal peripheral visual fields Cranial nerves III through XII normal including no facial droop Follows commands, moves all extremities x4, normal strength to bilateral upper and lower extremities at all major muscle groups including grip Sensation normal to light touch and pinprick Coordination intact, no limb ataxia, finger-nose-finger normal Rapid alternating movements normal No pronator drift Gait normal   Skin: Skin is warm and dry. No rash noted. No erythema.  Psychiatric: She has a normal mood and affect. Her behavior is normal.  Nursing note and vitals reviewed.   ED Course  Procedures (including critical care time) Labs Review Labs Reviewed  BASIC METABOLIC PANEL - Abnormal; Notable for the following:    Creatinine, Ser 1.08 (*)  All other components within normal limits  CBC WITH DIFFERENTIAL/PLATELET  PROTIME-INR  I-STAT BETA HCG BLOOD, ED ([facility], [facility], [facility] ONLY)    Imaging Review Ct Head Wo Contrast  01/24/2015  CLINICAL DATA:  Head injury. Father passed and yesterday drank to much ETOH fell and this am has swelling to bil eyes and nasal area. States some pain to forehead area, some  intermit blurred vision. Alert x4, does not remember all events from last night. EXAM: CT HEAD WITHOUT CONTRAST CT MAXILLOFACIAL WITHOUT CONTRAST CT CERVICAL SPINE WITHOUT CONTRAST TECHNIQUE: Multidetector CT imaging of the head, cervical spine, and maxillofacial structures were performed using the standard protocol without intravenous contrast. Multiplanar CT image reconstructions of the cervical spine and maxillofacial structures were also generated. COMPARISON:  None. FINDINGS: CT HEAD FINDINGS Ventricles and sulci appear symmetrical. No mass effect or midline shift. No abnormal extra-axial fluid collections. Gray-white matter junctions are distinct. Basal cisterns are not effaced. No evidence of acute intracranial hemorrhage. No depressed skull fractures. Mucosal thickening in the paranasal sinuses with small retention cyst in the left maxillary antrum. Mastoid air cells are not opacified. CT MAXILLOFACIAL FINDINGS The globes and extraocular muscles appear intact and symmetrical. New mild mucosal thickening in the paranasal sinuses. Small retention cyst in the left maxillary antrum. Opacification of the ostiomeatal complexes bilaterally due to mucosal thickening. This is greater on the left. Mucosal thickening in the ethmoid air cells and sphenoid sinuses. Frontal sinuses are clear. No acute air-fluid levels in the sinuses. Mastoid air cells are not opacified. Incidental note of prominent aeration of the petrous bones. Mild nasal bone deformity is nonspecific in could represent old or acute fracture. Nasal septum is midline. No nasal septal thickening. Frontal bones, orbital rims, maxillary antral walls, pterygoid plates, zygomatic arches, mandibles, and temporomandibular joints appear intact. No acute displaced fractures identified. Visualized soft tissue structures are unremarkable. Cervical and submental lymph nodes are not pathologically enlarged. Previous dental extractions. CT CERVICAL SPINE FINDINGS  Straightening of the usual cervical lordosis. This may be due to patient positioning but ligamentous injury or muscle spasm could also have this appearance. No anterior subluxation. Normal alignment of the facet joints. C1-2 articulation appears intact. No vertebral compression deformities. Intervertebral disc space heights are preserved. No prevertebral soft tissue swelling. No focal bone lesion or bone destruction. Congenital nonunion of the posterior arch of C1. IMPRESSION: No acute intracranial abnormalities. Mucosal thickening in the paranasal sinuses consistent with inflammatory process. No acute air-fluid levels or displaced fractures identified. Irregularity of the nasal bones may represent old or acute nondisplaced fractures. Nonspecific straightening of the usual cervical lordosis. No acute displaced fractures identified in the cervical spine. Electronically Signed   By: Burman Nieves M.D.   On: 01/24/2015 19:21   Ct Cervical Spine Wo Contrast  01/24/2015  CLINICAL DATA:  Head injury. Father passed and yesterday drank to much ETOH fell and this am has swelling to bil eyes and nasal area. States some pain to forehead area, some intermit blurred vision. Alert x4, does not remember all events from last night. EXAM: CT HEAD WITHOUT CONTRAST CT MAXILLOFACIAL WITHOUT CONTRAST CT CERVICAL SPINE WITHOUT CONTRAST TECHNIQUE: Multidetector CT imaging of the head, cervical spine, and maxillofacial structures were performed using the standard protocol without intravenous contrast. Multiplanar CT image reconstructions of the cervical spine and maxillofacial structures were also generated. COMPARISON:  None. FINDINGS: CT HEAD FINDINGS Ventricles and sulci appear symmetrical. No mass effect or midline shift. No abnormal extra-axial fluid collections. Gray-white matter  junctions are distinct. Basal cisterns are not effaced. No evidence of acute intracranial hemorrhage. No depressed skull fractures. Mucosal thickening  in the paranasal sinuses with small retention cyst in the left maxillary antrum. Mastoid air cells are not opacified. CT MAXILLOFACIAL FINDINGS The globes and extraocular muscles appear intact and symmetrical. New mild mucosal thickening in the paranasal sinuses. Small retention cyst in the left maxillary antrum. Opacification of the ostiomeatal complexes bilaterally due to mucosal thickening. This is greater on the left. Mucosal thickening in the ethmoid air cells and sphenoid sinuses. Frontal sinuses are clear. No acute air-fluid levels in the sinuses. Mastoid air cells are not opacified. Incidental note of prominent aeration of the petrous bones. Mild nasal bone deformity is nonspecific in could represent old or acute fracture. Nasal septum is midline. No nasal septal thickening. Frontal bones, orbital rims, maxillary antral walls, pterygoid plates, zygomatic arches, mandibles, and temporomandibular joints appear intact. No acute displaced fractures identified. Visualized soft tissue structures are unremarkable. Cervical and submental lymph nodes are not pathologically enlarged. Previous dental extractions. CT CERVICAL SPINE FINDINGS Straightening of the usual cervical lordosis. This may be due to patient positioning but ligamentous injury or muscle spasm could also have this appearance. No anterior subluxation. Normal alignment of the facet joints. C1-2 articulation appears intact. No vertebral compression deformities. Intervertebral disc space heights are preserved. No prevertebral soft tissue swelling. No focal bone lesion or bone destruction. Congenital nonunion of the posterior arch of C1. IMPRESSION: No acute intracranial abnormalities. Mucosal thickening in the paranasal sinuses consistent with inflammatory process. No acute air-fluid levels or displaced fractures identified. Irregularity of the nasal bones may represent old or acute nondisplaced fractures. Nonspecific straightening of the usual cervical  lordosis. No acute displaced fractures identified in the cervical spine. Electronically Signed   By: Burman Nieves M.D.   On: 01/24/2015 19:21   Ct Maxillofacial Wo Cm  01/24/2015  CLINICAL DATA:  Head injury. Father passed and yesterday drank to much ETOH fell and this am has swelling to bil eyes and nasal area. States some pain to forehead area, some intermit blurred vision. Alert x4, does not remember all events from last night. EXAM: CT HEAD WITHOUT CONTRAST CT MAXILLOFACIAL WITHOUT CONTRAST CT CERVICAL SPINE WITHOUT CONTRAST TECHNIQUE: Multidetector CT imaging of the head, cervical spine, and maxillofacial structures were performed using the standard protocol without intravenous contrast. Multiplanar CT image reconstructions of the cervical spine and maxillofacial structures were also generated. COMPARISON:  None. FINDINGS: CT HEAD FINDINGS Ventricles and sulci appear symmetrical. No mass effect or midline shift. No abnormal extra-axial fluid collections. Gray-white matter junctions are distinct. Basal cisterns are not effaced. No evidence of acute intracranial hemorrhage. No depressed skull fractures. Mucosal thickening in the paranasal sinuses with small retention cyst in the left maxillary antrum. Mastoid air cells are not opacified. CT MAXILLOFACIAL FINDINGS The globes and extraocular muscles appear intact and symmetrical. New mild mucosal thickening in the paranasal sinuses. Small retention cyst in the left maxillary antrum. Opacification of the ostiomeatal complexes bilaterally due to mucosal thickening. This is greater on the left. Mucosal thickening in the ethmoid air cells and sphenoid sinuses. Frontal sinuses are clear. No acute air-fluid levels in the sinuses. Mastoid air cells are not opacified. Incidental note of prominent aeration of the petrous bones. Mild nasal bone deformity is nonspecific in could represent old or acute fracture. Nasal septum is midline. No nasal septal thickening.  Frontal bones, orbital rims, maxillary antral walls, pterygoid plates, zygomatic arches, mandibles, and  temporomandibular joints appear intact. No acute displaced fractures identified. Visualized soft tissue structures are unremarkable. Cervical and submental lymph nodes are not pathologically enlarged. Previous dental extractions. CT CERVICAL SPINE FINDINGS Straightening of the usual cervical lordosis. This may be due to patient positioning but ligamentous injury or muscle spasm could also have this appearance. No anterior subluxation. Normal alignment of the facet joints. C1-2 articulation appears intact. No vertebral compression deformities. Intervertebral disc space heights are preserved. No prevertebral soft tissue swelling. No focal bone lesion or bone destruction. Congenital nonunion of the posterior arch of C1. IMPRESSION: No acute intracranial abnormalities. Mucosal thickening in the paranasal sinuses consistent with inflammatory process. No acute air-fluid levels or displaced fractures identified. Irregularity of the nasal bones may represent old or acute nondisplaced fractures. Nonspecific straightening of the usual cervical lordosis. No acute displaced fractures identified in the cervical spine. Electronically Signed   By: Burman Nieves M.D.   On: 01/24/2015 19:21   I have personally reviewed and evaluated these images and lab results as part of my medical decision-making.  MDM   Final diagnoses:  Nasal fracture, closed, initial encounter  Head injury, initial encounter   Control was good, vital signs unremarkable, neurologic exam unremarkable, concern for basilar skull fracture versus hematoma of the forehead developing with periorbital bruising. CT scans ordered, immobilized neck with cervical collar before CT scan, the patient initially came to the ER than left and now has come back, immobilized on repeat visit   CT showed nasal fracture  Meds given in ED:  Medications - No data to  display  New Prescriptions   IBUPROFEN (ADVIL,MOTRIN) 800 MG TABLET    Take 1 tablet (800 mg total) by mouth 3 (three) times daily.       Eber Hong, MD 01/24/15 1929

## 2015-01-24 NOTE — ED Notes (Signed)
Pt reported tripping and falling and hitting head. Noted facial swelling, c/o facial pressure and bil swelling and bruising to both eyes. Pt reported not able to recall the entire events, denies LOC, nausea, and visual disturbances. Applied c-collar and given pt ice pk for face.

## 2015-01-24 NOTE — ED Notes (Signed)
Father passed and yesterday drank to much ETOH fell and this am has swelling to bil eyes and nasal area. States some pain to forehead area, some intermit blurred vision. Alert x4, does not remember all events from last night.

## 2015-01-24 NOTE — ED Notes (Signed)
Patient called to room with no answer

## 2015-01-24 NOTE — Discharge Instructions (Signed)

## 2015-01-24 NOTE — ED Notes (Signed)
No answer from waiting room.

## 2015-04-03 ENCOUNTER — Emergency Department (HOSPITAL_COMMUNITY)
Admission: EM | Admit: 2015-04-03 | Discharge: 2015-04-03 | Disposition: A | Discharge status: Home / Self Care | Payer: Medicaid Other | Attending: Emergency Medicine | Admitting: Emergency Medicine

## 2015-04-03 ENCOUNTER — Encounter (HOSPITAL_COMMUNITY): Payer: Self-pay | Admitting: Physical Medicine and Rehabilitation

## 2015-04-03 DIAGNOSIS — F17210 Nicotine dependence, cigarettes, uncomplicated: Secondary | ICD-10-CM | POA: Insufficient documentation

## 2015-04-03 DIAGNOSIS — Y9389 Activity, other specified: Secondary | ICD-10-CM | POA: Insufficient documentation

## 2015-04-03 DIAGNOSIS — S3992XA Unspecified injury of lower back, initial encounter: Secondary | ICD-10-CM | POA: Diagnosis present

## 2015-04-03 DIAGNOSIS — X58XXXA Exposure to other specified factors, initial encounter: Secondary | ICD-10-CM | POA: Insufficient documentation

## 2015-04-03 DIAGNOSIS — Y9289 Other specified places as the place of occurrence of the external cause: Secondary | ICD-10-CM | POA: Insufficient documentation

## 2015-04-03 DIAGNOSIS — Y998 Other external cause status: Secondary | ICD-10-CM | POA: Insufficient documentation

## 2015-04-03 DIAGNOSIS — S39012A Strain of muscle, fascia and tendon of lower back, initial encounter: Secondary | ICD-10-CM

## 2015-04-03 MED ORDER — CYCLOBENZAPRINE HCL 10 MG PO TABS: 10.0000 mg | ORAL_TABLET | Freq: Two times a day (BID) | ORAL | Status: DC | PRN

## 2015-04-03 MED ORDER — NAPROXEN 500 MG PO TABS: 500.0000 mg | ORAL_TABLET | Freq: Two times a day (BID) | ORAL | Status: DC

## 2015-04-03 NOTE — ED Notes (Signed)
Declined W/C at D/C and was escorted to lobby by RN. 

## 2015-04-03 NOTE — ED Notes (Signed)
Pt reports lower back pain. Ongoing x3 days. Denies urinary issues.

## 2015-04-03 NOTE — ED Provider Notes (Signed)
CSN: [id number]     Arrival date & time 04/03/15  0909 History  By signing my name below, I, Rohini Rajnarayanan, attest that this documentation has been prepared under the direction and in the presence of Gaylyn RongSamantha Tristina Sahagian PA-C Electronically Signed: Charlean Merlohini Rajnarayanan, ED Scribe 04/03/2015 at 9:47 AM.    Chief Complaint  Patient presents with  . Back Pain   The history is provided by the patient. No language interpreter was used.   HPI Comments: Lisa Blevins is a 30 y.o. female with a pshx of arm surgery, who presents to the Emergency Department complaining of gradual onset, cramping, pressure, and pain to the lower back x3 days. Pain is exacerbated by coughing and movement. Pt also admits to some numbness and tingling in her upper left leg. Pt is ambulatory. Pt denies any similar sx in the past. Pt recently moved and was lifting heavy boxes and subsequently developed the pain. Pt has taken ibuprofen with no relief. Pt denies any urinary/bladder incontinence/difficulties.   History reviewed. No pertinent past medical history. Past Surgical History  Procedure Laterality Date  . Arm surgery     No family history on file. Social History  Substance Use Topics  . Smoking status: Current Every Day Smoker -- 0.50 packs/day    Types: Cigarettes  . Smokeless tobacco: None  . Alcohol Use: No   OB History    No data available     Review of Systems  All other systems reviewed and are negative.  10 Systems reviewed and all are negative for acute change except as noted in the HPI.  Allergies  Review of patient's allergies indicates no known allergies.  Home Medications   Prior to Admission medications   Medication Sig Start Date End Date Taking? Authorizing Provider  ibuprofen (ADVIL,MOTRIN) 800 MG tablet Take 1 tablet (800 mg total) by mouth 3 (three) times daily. 01/24/15   Eber HongBrian Miller, MD   BP 126/73 mmHg  Pulse 65  Temp(Src) 97.6 F (36.4 C) (Oral)  Resp 14  Ht 5\' 11"  (1.803 m)   Wt 230 lb (104.327 kg)  BMI 32.09 kg/m2  SpO2 100% Physical Exam  Constitutional: She is oriented to person, place, and time. She appears well-developed and well-nourished. No distress.  HENT:  Head: Normocephalic and atraumatic.  Eyes: Conjunctivae are normal. Right eye exhibits no discharge. Left eye exhibits no discharge. No scleral icterus.  Cardiovascular: Normal rate.   Pulmonary/Chest: Effort normal.  Musculoskeletal:  No midline spinal tenderness. FROM of C, T, L spine. No stepoff. No obvious bony deformities. Mild TTP of b/l lumbar paraspinal muscles.   Neurological: She is alert and oriented to person, place, and time. Coordination normal.  Strength 5/5 throughout. No sensory deficits. No gait abnormality.   Skin: Skin is warm and dry. No rash noted. She is not diaphoretic. No erythema. No pallor.  Psychiatric: She has a normal mood and affect. Her behavior is normal.  Nursing note and vitals reviewed.   ED Course  Procedures  DIAGNOSTIC STUDIES: Oxygen Saturation is 100% on RA, normal by my interpretation.    COORDINATION OF CARE: 9:50 AM-Discussed treatment plan which includes Flexeril and Naprosyn with pt at bedside and pt agreed to plan.   Labs Review Labs Reviewed - No data to display  Imaging Review No results found. I have personally reviewed and evaluated these images and lab results as part of my medical decision-making.   EKG Interpretation None      MDM   Final  diagnoses:  Lumbar strain, initial encounter   Patient with back pain after lifting heavy boxes 3 days ago.  No neurological deficits and normal neuro exam.  Patient is ambulatory in the ED without difficulty.  No loss of bowel or bladder control.  No concern for cauda equina.  No fever, night sweats, weight loss, h/o cancer, IVDA, no recent procedure to back. No urinary symptoms suggestive of UTI.  Supportive care and return precaution discussed. Appears safe for discharge at this time.  Follow up as indicated in discharge paperwork.   I personally performed the services described in this documentation, which was scribed in my presence. The recorded information has been reviewed and is accurate.       Lester Kinsman [place], PA-C 04/03/15 1057  Nelva Nay, MD 04/08/15 [phone]

## 2015-04-03 NOTE — Discharge Instructions (Signed)
Low Back Strain With Rehab A strain is an injury in which a tendon or muscle is torn. The muscles and tendons of the lower back are vulnerable to strains. However, these muscles and tendons are very strong and require a great force to be injured. Strains are classified into three categories. Grade 1 strains cause pain, but the tendon is not lengthened. Grade 2 strains include a lengthened ligament, due to the ligament being stretched or partially ruptured. With grade 2 strains there is still function, although the function may be decreased. Grade 3 strains involve a complete tear of the tendon or muscle, and function is usually impaired. SYMPTOMS   Pain in the lower back.  Pain that affects one side more than the other.  Pain that gets worse with movement and may be felt in the hip, buttocks, or back of the thigh.  Muscle spasms of the muscles in the back.  Swelling along the muscles of the back.  Loss of strength of the back muscles.  Crackling sound (crepitation) when the muscles are touched. CAUSES  Lower back strains occur when a force is placed on the muscles or tendons that is greater than they can handle. Common causes of injury include:  Prolonged overuse of the muscle-tendon units in the lower back, usually from incorrect posture.  A single violent injury or force applied to the back. RISK INCREASES WITH:  Sports that involve twisting forces on the spine or a lot of bending at the waist (football, rugby, weightlifting, bowling, golf, tennis, speed skating, racquetball, swimming, running, gymnastics, diving).  Poor strength and flexibility.  Failure to warm up properly before activity.  Family history of lower back pain or disk disorders.  Previous back injury or surgery (especially fusion).  Poor posture with lifting, especially heavy objects.  Prolonged sitting, especially with poor posture. PREVENTION   Learn and use proper posture when sitting or lifting (maintain  proper posture when sitting, lift using the knees and legs, not at the waist).  Warm up and stretch properly before activity.  Allow for adequate recovery between workouts.  Maintain physical fitness:  Strength, flexibility, and endurance.  Cardiovascular fitness. PROGNOSIS  If treated properly, lower back strains usually heal within 6 weeks. RELATED COMPLICATIONS   Recurring symptoms, resulting in a chronic problem.  Chronic inflammation, scarring, and partial muscle-tendon tear.  Delayed healing or resolution of symptoms.  Prolonged disability. TREATMENT  Treatment first involves the use of ice and medicine, to reduce pain and inflammation. The use of strengthening and stretching exercises may help reduce pain with activity. These exercises may be performed at home or with a therapist. Severe injuries may require referral to a therapist for further evaluation and treatment, such as ultrasound. Your caregiver may advise that you wear a back brace or corset, to help reduce pain and discomfort. Often, prolonged bed rest results in greater harm then benefit. Corticosteroid injections may be recommended. However, these should be reserved for the most serious cases. It is important to avoid using your back when lifting objects. At night, sleep on your back on a firm mattress with a pillow placed under your knees. If non-surgical treatment is unsuccessful, surgery may be needed.  MEDICATION   If pain medicine is needed, nonsteroidal anti-inflammatory medicines (aspirin and ibuprofen), or other minor pain relievers (acetaminophen), are often advised.  Do not take pain medicine for 7 days before surgery.  Prescription pain relievers may be given, if your caregiver thinks they are needed. Use only as  directed and only as much as you need.  Ointments applied to the skin may be helpful.  Corticosteroid injections may be given by your caregiver. These injections should be reserved for the most  serious cases, because they may only be given a certain number of times. HEAT AND COLD  Cold treatment (icing) should be applied for 10 to 15 minutes every 2 to 3 hours for inflammation and pain, and immediately after activity that aggravates your symptoms. Use ice packs or an ice massage.  Heat treatment may be used before performing stretching and strengthening activities prescribed by your caregiver, physical therapist, or athletic trainer. Use a heat pack or a warm water soak. SEEK MEDICAL CARE IF:   Symptoms get worse or do not improve in 2 to 4 weeks, despite treatment.  You develop numbness, weakness, or loss of bowel or bladder function.  New, unexplained symptoms develop. (Drugs used in treatment may produce side effects.) EXERCISES  RANGE OF MOTION (ROM) AND STRETCHING EXERCISES - Low Back Strain Most people with lower back pain will find that their symptoms get worse with excessive bending forward (flexion) or arching at the lower back (extension). The exercises which will help resolve your symptoms will focus on the opposite motion.  Your physician, physical therapist or athletic trainer will help you determine which exercises will be most helpful to resolve your lower back pain. Do not complete any exercises without first consulting with your caregiver. Discontinue any exercises which make your symptoms worse until you speak to your caregiver.  If you have pain, numbness or tingling which travels down into your buttocks, leg or foot, the goal of the therapy is for these symptoms to move closer to your back and eventually resolve. Sometimes, these leg symptoms will get better, but your lower back pain may worsen. This is typically an indication of progress in your rehabilitation. Be very alert to any changes in your symptoms and the activities in which you participated in the 24 hours prior to the change. Sharing this information with your caregiver will allow him/her to most efficiently  treat your condition.  These exercises may help you when beginning to rehabilitate your injury. Your symptoms may resolve with or without further involvement from your physician, physical therapist or athletic trainer. While completing these exercises, remember:  Restoring tissue flexibility helps normal motion to return to the joints. This allows healthier, less painful movement and activity.  An effective stretch should be held for at least 30 seconds.  A stretch should never be painful. You should only feel a gentle lengthening or release in the stretched tissue. FLEXION RANGE OF MOTION AND STRETCHING EXERCISES: STRETCH - Flexion, Single Knee to Chest   Lie on a firm bed or floor with both legs extended in front of you.  Keeping one leg in contact with the floor, bring your opposite knee to your chest. Hold your leg in place by either grabbing behind your thigh or at your knee.  Pull until you feel a gentle stretch in your lower back. Hold __________ seconds.  Slowly release your grasp and repeat the exercise with the opposite side. Repeat __________ times. Complete this exercise __________ times per day.  STRETCH - Flexion, Double Knee to Chest   Lie on a firm bed or floor with both legs extended in front of you.  Keeping one leg in contact with the floor, bring your opposite knee to your chest.  Tense your stomach muscles to support your back and then   lift your other knee to your chest. Hold your legs in place by either grabbing behind your thighs or at your knees.  Pull both knees toward your chest until you feel a gentle stretch in your lower back. Hold __________ seconds.  Tense your stomach muscles and slowly return one leg at a time to the floor. Repeat __________ times. Complete this exercise __________ times per day.  STRETCH - Low Trunk Rotation  Lie on a firm bed or floor. Keeping your legs in front of you, bend your knees so they are both pointed toward the ceiling  and your feet are flat on the floor.  Extend your arms out to the side. This will stabilize your upper body by keeping your shoulders in contact with the floor.  Gently and slowly drop both knees together to one side until you feel a gentle stretch in your lower back. Hold for __________ seconds.  Tense your stomach muscles to support your lower back as you bring your knees back to the starting position. Repeat the exercise to the other side. Repeat __________ times. Complete this exercise __________ times per day  EXTENSION RANGE OF MOTION AND FLEXIBILITY EXERCISES: STRETCH - Extension, Prone on Elbows   Lie on your stomach on the floor, a bed will be too soft. Place your palms about shoulder width apart and at the height of your head.  Place your elbows under your shoulders. If this is too painful, stack pillows under your chest.  Allow your body to relax so that your hips drop lower and make contact more completely with the floor.  Hold this position for __________ seconds.  Slowly return to lying flat on the floor. Repeat __________ times. Complete this exercise __________ times per day.  RANGE OF MOTION - Extension, Prone Press Ups  Lie on your stomach on the floor, a bed will be too soft. Place your palms about shoulder width apart and at the height of your head.  Keeping your back as relaxed as possible, slowly straighten your elbows while keeping your hips on the floor. You may adjust the placement of your hands to maximize your comfort. As you gain motion, your hands will come more underneath your shoulders.  Hold this position __________ seconds.  Slowly return to lying flat on the floor. Repeat __________ times. Complete this exercise __________ times per day.  RANGE OF MOTION- Quadruped, Neutral Spine   Assume a hands and knees position on a firm surface. Keep your hands under your shoulders and your knees under your hips. You may place padding under your knees for  comfort.  Drop your head and point your tail bone toward the ground below you. This will round out your lower back like an angry cat. Hold this position for __________ seconds.  Slowly lift your head and release your tail bone so that your back sags into a large arch, like an old horse.  Hold this position for __________ seconds.  Repeat this until you feel limber in your lower back.  Now, find your "sweet spot." This will be the most comfortable position somewhere between the two previous positions. This is your neutral spine. Once you have found this position, tense your stomach muscles to support your lower back.  Hold this position for __________ seconds. Repeat __________ times. Complete this exercise __________ times per day.  STRENGTHENING EXERCISES - Low Back Strain These exercises may help you when beginning to rehabilitate your injury. These exercises should be done near your "sweet   spot." This is the neutral, low-back arch, somewhere between fully rounded and fully arched, that is your least painful position. When performed in this safe range of motion, these exercises can be used for people who have either a flexion or extension based injury. These exercises may resolve your symptoms with or without further involvement from your physician, physical therapist or athletic trainer. While completing these exercises, remember:  °· Muscles can gain both the endurance and the strength needed for everyday activities through controlled exercises. °· Complete these exercises as instructed by your physician, physical therapist or athletic trainer. Increase the resistance and repetitions only as guided. °· You may experience muscle soreness or fatigue, but the pain or discomfort you are trying to eliminate should never worsen during these exercises. If this pain does worsen, stop and make certain you are following the directions exactly. If the pain is still present after adjustments, discontinue the  exercise until you can discuss the trouble with your caregiver. °STRENGTHENING - Deep Abdominals, Pelvic Tilt °· Lie on a firm bed or floor. Keeping your legs in front of you, bend your knees so they are both pointed toward the ceiling and your feet are flat on the floor. °· Tense your lower abdominal muscles to press your lower back into the floor. This motion will rotate your pelvis so that your tail bone is scooping upwards rather than pointing at your feet or into the floor. °· With a gentle tension and even breathing, hold this position for __________ seconds. °Repeat __________ times. Complete this exercise __________ times per day.  °STRENGTHENING - Abdominals, Crunches  °· Lie on a firm bed or floor. Keeping your legs in front of you, bend your knees so they are both pointed toward the ceiling and your feet are flat on the floor. Cross your arms over your chest. °· Slightly tip your chin down without bending your neck. °· Tense your abdominals and slowly lift your trunk high enough to just clear your shoulder blades. Lifting higher can put excessive stress on the lower back and does not further strengthen your abdominal muscles. °· Control your return to the starting position. °Repeat __________ times. Complete this exercise __________ times per day.  °STRENGTHENING - Quadruped, Opposite UE/LE Lift  °· Assume a hands and knees position on a firm surface. Keep your hands under your shoulders and your knees under your hips. You may place padding under your knees for comfort. °· Find your neutral spine and gently tense your abdominal muscles so that you can maintain this position. Your shoulders and hips should form a rectangle that is parallel with the floor and is not twisted. °· Keeping your trunk steady, lift your right hand no higher than your shoulder and then your left leg no higher than your hip. Make sure you are not holding your breath. Hold this position __________ seconds. °· Continuing to keep your  abdominal muscles tense and your back steady, slowly return to your starting position. Repeat with the opposite arm and leg. °Repeat __________ times. Complete this exercise __________ times per day.  °STRENGTHENING - Lower Abdominals, Double Knee Lift °· Lie on a firm bed or floor. Keeping your legs in front of you, bend your knees so they are both pointed toward the ceiling and your feet are flat on the floor. °· Tense your abdominal muscles to brace your lower back and slowly lift both of your knees until they come over your hips. Be certain not to hold your breath. °·   Hold __________ seconds. Using your abdominal muscles, return to the starting position in a slow and controlled manner. Repeat __________ times. Complete this exercise __________ times per day.  POSTURE AND BODY MECHANICS CONSIDERATIONS - Low Back Strain Keeping correct posture when sitting, standing or completing your activities will reduce the stress put on different body tissues, allowing injured tissues a chance to heal and limiting painful experiences. The following are general guidelines for improved posture. Your physician or physical therapist will provide you with any instructions specific to your needs. While reading these guidelines, remember:  The exercises prescribed by your provider will help you have the flexibility and strength to maintain correct postures.  The correct posture provides the best environment for your joints to work. All of your joints have less wear and tear when properly supported by a spine with good posture. This means you will experience a healthier, less painful body.  Correct posture must be practiced with all of your activities, especially prolonged sitting and standing. Correct posture is as important when doing repetitive low-stress activities (typing) as it is when doing a single heavy-load activity (lifting). RESTING POSITIONS Consider which positions are most painful for you when choosing a  resting position. If you have pain with flexion-based activities (sitting, bending, stooping, squatting), choose a position that allows you to rest in a less flexed posture. You would want to avoid curling into a fetal position on your side. If your pain worsens with extension-based activities (prolonged standing, working overhead), avoid resting in an extended position such as sleeping on your stomach. Most people will find more comfort when they rest with their spine in a more neutral position, neither too rounded nor too arched. Lying on a non-sagging bed on your side with a pillow between your knees, or on your back with a pillow under your knees will often provide some relief. Keep in mind, being in any one position for a prolonged period of time, no matter how correct your posture, can still lead to stiffness. PROPER SITTING POSTURE In order to minimize stress and discomfort on your spine, you must sit with correct posture. Sitting with good posture should be effortless for a healthy body. Returning to good posture is a gradual process. Many people can work toward this most comfortably by using various supports until they have the flexibility and strength to maintain this posture on their own. When sitting with proper posture, your ears will fall over your shoulders and your shoulders will fall over your hips. You should use the back of the chair to support your upper back. Your lower back will be in a neutral position, just slightly arched. You may place a small pillow or folded towel at the base of your lower back for support.  When working at a desk, create an environment that supports good, upright posture. Without extra support, muscles tire, which leads to excessive strain on joints and other tissues. Keep these recommendations in mind: CHAIR:  A chair should be able to slide under your desk when your back makes contact with the back of the chair. This allows you to work closely.  The chair's  height should allow your eyes to be level with the upper part of your monitor and your hands to be slightly lower than your elbows. BODY POSITION  Your feet should make contact with the floor. If this is not possible, use a foot rest.  Keep your ears over your shoulders. This will reduce stress on your neck and  lower back. INCORRECT SITTING POSTURES  If you are feeling tired and unable to assume a healthy sitting posture, do not slouch or slump. This puts excessive strain on your back tissues, causing more damage and pain. Healthier options include:  Using more support, like a lumbar pillow.  Switching tasks to something that requires you to be upright or walking.  Talking a brief walk.  Lying down to rest in a neutral-spine position. PROLONGED STANDING WHILE SLIGHTLY LEANING FORWARD  When completing a task that requires you to lean forward while standing in one place for a long time, place either foot up on a stationary 2-4 inch high object to help maintain the best posture. When both feet are on the ground, the lower back tends to lose its slight inward curve. If this curve flattens (or becomes too large), then the back and your other joints will experience too much stress, tire more quickly, and can cause pain. CORRECT STANDING POSTURES Proper standing posture should be assumed with all daily activities, even if they only take a few moments, like when brushing your teeth. As in sitting, your ears should fall over your shoulders and your shoulders should fall over your hips. You should keep a slight tension in your abdominal muscles to brace your spine. Your tailbone should point down to the ground, not behind your body, resulting in an over-extended swayback posture.  INCORRECT STANDING POSTURES  Common incorrect standing postures include a forward head, locked knees and/or an excessive swayback. WALKING Walk with an upright posture. Your ears, shoulders and hips should all  line-up. PROLONGED ACTIVITY IN A FLEXED POSITION When completing a task that requires you to bend forward at your waist or lean over a low surface, try to find a way to stabilize 3 out of 4 of your limbs. You can place a hand or elbow on your thigh or rest a knee on the surface you are reaching across. This will provide you more stability so that your muscles do not fatigue as quickly. By keeping your knees relaxed, or slightly bent, you will also reduce stress across your lower back. CORRECT LIFTING TECHNIQUES DO :   Assume a wide stance. This will provide you more stability and the opportunity to get as close as possible to the object which you are lifting.  Tense your abdominals to brace your spine. Bend at the knees and hips. Keeping your back locked in a neutral-spine position, lift using your leg muscles. Lift with your legs, keeping your back straight.  Test the weight of unknown objects before attempting to lift them.  Try to keep your elbows locked down at your sides in order get the best strength from your shoulders when carrying an object.  Always ask for help when lifting heavy or awkward objects. INCORRECT LIFTING TECHNIQUES DO NOT:   Lock your knees when lifting, even if it is a small object.  Bend and twist. Pivot at your feet or move your feet when needing to change directions.  Assume that you can safely pick up even a paper clip without proper posture.   This information is not intended to replace advice given to you by your health care provider. Make sure you discuss any questions you have with your health care provider.  Follow up with your primary care provider if your symptoms do not improve. Take Naprosyn and flexeril as needed for pain and inflammation. Apply ice to affected area. Return to the ED if you experience severe worsening of  your symptoms, fever, loss of bowel or bladder control, numbness or tingling in both of your lower extremities.

## 2015-10-19 ENCOUNTER — Encounter (HOSPITAL_COMMUNITY): Payer: Self-pay | Admitting: Emergency Medicine

## 2015-10-19 ENCOUNTER — Emergency Department (HOSPITAL_COMMUNITY)
Admission: EM | Admit: 2015-10-19 | Discharge: 2015-10-19 | Disposition: A | Discharge status: Home / Self Care | Payer: Medicaid Other | Attending: Emergency Medicine | Admitting: Emergency Medicine

## 2015-10-19 DIAGNOSIS — J069 Acute upper respiratory infection, unspecified: Secondary | ICD-10-CM | POA: Insufficient documentation

## 2015-10-19 DIAGNOSIS — R112 Nausea with vomiting, unspecified: Secondary | ICD-10-CM | POA: Diagnosis present

## 2015-10-19 DIAGNOSIS — F17210 Nicotine dependence, cigarettes, uncomplicated: Secondary | ICD-10-CM | POA: Diagnosis not present

## 2015-10-19 LAB — URINALYSIS, ROUTINE W REFLEX MICROSCOPIC
BILIRUBIN URINE: NEGATIVE
Glucose, UA: NEGATIVE mg/dL
HGB URINE DIPSTICK: NEGATIVE
Ketones, ur: NEGATIVE mg/dL
Leukocytes, UA: NEGATIVE
Nitrite: NEGATIVE
PH: 7 (ref 5.0–8.0)
Protein, ur: NEGATIVE mg/dL
SPECIFIC GRAVITY, URINE: 1.003 — AB (ref 1.005–1.030)

## 2015-10-19 LAB — COMPREHENSIVE METABOLIC PANEL
ALBUMIN: 4.3 g/dL (ref 3.5–5.0)
ALK PHOS: 39 U/L (ref 38–126)
ALT: 21 U/L (ref 14–54)
ANION GAP: 4 — AB (ref 5–15)
AST: 27 U/L (ref 15–41)
BUN: 11 mg/dL (ref 6–20)
CALCIUM: 8.9 mg/dL (ref 8.9–10.3)
CO2: 25 mmol/L (ref 22–32)
Chloride: 106 mmol/L (ref 101–111)
Creatinine, Ser: 0.71 mg/dL (ref 0.44–1.00)
GFR calc Af Amer: >60 mL/min (ref >60)
GFR calc non Af Amer: >60 mL/min (ref >60)
GLUCOSE: 93 mg/dL (ref 65–99)
POTASSIUM: 4.1 mmol/L (ref 3.5–5.1)
SODIUM: 135 mmol/L (ref 135–145)
Total Bilirubin: 0.6 mg/dL (ref 0.3–1.2)
Total Protein: 7.4 g/dL (ref 6.5–8.1)

## 2015-10-19 LAB — CBC
HEMATOCRIT: 35.9 % — AB (ref 36.0–46.0)
HEMOGLOBIN: 12 g/dL (ref 12.0–15.0)
MCH: 28.2 pg (ref 26.0–34.0)
MCHC: 33.4 g/dL (ref 30.0–36.0)
MCV: 84.3 fL (ref 78.0–100.0)
Platelets: 230 10*3/uL (ref 150–400)
RBC: 4.26 MIL/uL (ref 3.87–5.11)
RDW: 13.5 % (ref 11.5–15.5)
WBC: 5.3 10*3/uL (ref 4.0–10.5)

## 2015-10-19 LAB — LIPASE, BLOOD: Lipase: 18 U/L (ref 11–51)

## 2015-10-19 LAB — PREGNANCY, URINE: Preg Test, Ur: NEGATIVE

## 2015-10-19 MED ORDER — GUAIFENESIN 100 MG/5ML PO LIQD: 100.0000 mg | ORAL | 0 refills | Status: DC | PRN

## 2015-10-19 NOTE — ED Provider Notes (Signed)
[facility]-EMERGENCY DEPT Provider Note   CSN: [id number] Arrival date & time: 10/19/15  1040     History   Chief Complaint Chief Complaint  Patient presents with  . Emesis    HPI Lisa Blevins is a 30 y.o. female.  HPI   30 year old female presenting c/o flu-like sxs.  Pt report This morning she woke up with head congestion, mild throat irritation, generalized fatigue, and now nausea. Symptom persistent but not worsened. Symptoms felt similar to prior sinus infection that she developed every other year. She report children with similar symptoms previously within the past week. She denies having significant fever, severe headache, neck stiffness, productive cough, hemoptysis, dysuria, or rash. No specific treatment tried. She is not sexually active for the past year. She is here requesting medication help her with her congestion, and requesting for work note. No other complaint.  History reviewed. No pertinent past medical history.  There are no active problems to display for this patient.   Past Surgical History:  Procedure Laterality Date  . arm surgery      OB History    No data available       Home Medications    Prior to Admission medications   Not on File    Family History History reviewed. No pertinent family history.  Social History Social History  Substance Use Topics  . Smoking status: Current Every Day Smoker    Packs/day: 0.50    Types: Cigarettes  . Smokeless tobacco: Never Used  . Alcohol use No     Allergies   Review of patient's allergies indicates no known allergies.   Review of Systems Review of Systems  All other systems reviewed and are negative.    Physical Exam Updated Vital Signs BP 123/64 (BP Location: Right Arm)   Pulse 62   Temp 98.7 F (37.1 C) (Oral)   Resp 17   SpO2 100%   Physical Exam  Constitutional: She appears well-developed and well-nourished. No distress.  HENT:  Head: Atraumatic.  Right Ear: External ear  normal.  Left Ear: External ear normal.  Nose: Nose normal.  Mouth/Throat: Oropharynx is clear and moist.  Eyes: Conjunctivae are normal.  Neck: Normal range of motion. Neck supple.  No nuchal rigidity  Cardiovascular: Normal rate and regular rhythm.   Pulmonary/Chest: Effort normal and breath sounds normal.  Abdominal: Soft. Bowel sounds are normal. There is no tenderness.  Lymphadenopathy:    She has no cervical adenopathy.  Neurological: She is alert.  Skin: No rash noted.  Psychiatric: She has a normal mood and affect.  Nursing note and vitals reviewed.    ED Treatments / Results  Labs (all labs ordered are listed, but only abnormal results are displayed) Labs Reviewed  COMPREHENSIVE METABOLIC PANEL - Abnormal; Notable for the following:       Result Value   Anion gap 4 (*)    All other components within normal limits  CBC - Abnormal; Notable for the following:    HCT 35.9 (*)    All other components within normal limits  URINALYSIS, ROUTINE W REFLEX MICROSCOPIC (NOT AT [facility]) - Abnormal; Notable for the following:    APPearance CLOUDY (*)    Specific Gravity, Urine 1.003 (*)    All other components within normal limits  LIPASE, BLOOD  PREGNANCY, URINE    EKG  EKG Interpretation None       Radiology No results found.  Procedures Procedures (including critical care time)  Medications Ordered  in ED Medications - No data to display   Initial Impression / Assessment and Plan / ED Course  I have reviewed the triage vital signs and the nursing notes.  Pertinent labs & imaging results that were available during my care of the patient were reviewed by me and considered in my medical decision making (see chart for details).  Clinical Course    BP 123/64 (BP Location: Right Arm)   Pulse 62   Temp 98.7 F (37.1 C) (Oral)   Resp 17   SpO2 100%    Final Clinical Impressions(s) / ED Diagnoses   Final diagnoses:  URI, acute    New Prescriptions New  Prescriptions   GUAIFENESIN (ROBITUSSIN) 100 MG/5ML LIQUID    Take 5-10 mLs (100-200 mg total) by mouth every 4 (four) hours as needed for cough or congestion.   1:52 PM Patient is well-appearing, presents with URI symptoms. Labs are reassuring. No shortness of breath, productive cough, or hypoxia to suggest pneumonia. She complained of sinus congestion, no significant tenderness on sinus exam.  Will provide decongestant.     Fayrene Helper, PA-C 10/19/15 1353    Rolland Porter, MD 11/01/15 2111

## 2015-10-19 NOTE — ED Triage Notes (Signed)
Pt states that her children have been sick at home with headache and cold like symptoms.  Pt states that she has been giving them home remedies but today she began throwing up and has felt like she has a fever but has not confirmed this.  States that she needed to come to the ER to "make sure she's ok".

## 2016-05-03 ENCOUNTER — Encounter: Payer: Self-pay | Admitting: Student

## 2016-05-03 ENCOUNTER — Encounter: Payer: Medicaid - Out of State | Admitting: Student

## 2016-07-27 ENCOUNTER — Encounter: Payer: Medicaid - Out of State | Admitting: Student

## 2016-10-02 ENCOUNTER — Encounter (HOSPITAL_COMMUNITY): Payer: Self-pay | Admitting: Emergency Medicine

## 2016-10-02 ENCOUNTER — Emergency Department (HOSPITAL_COMMUNITY): Payer: Medicaid Other

## 2016-10-02 ENCOUNTER — Emergency Department (HOSPITAL_COMMUNITY)
Admission: EM | Admit: 2016-10-02 | Discharge: 2016-10-02 | Disposition: A | Discharge status: Home / Self Care | Payer: Medicaid Other | Attending: Physician Assistant | Admitting: Physician Assistant

## 2016-10-02 DIAGNOSIS — R6889 Other general symptoms and signs: Secondary | ICD-10-CM | POA: Insufficient documentation

## 2016-10-02 DIAGNOSIS — R05 Cough: Secondary | ICD-10-CM | POA: Diagnosis present

## 2016-10-02 DIAGNOSIS — R5383 Other fatigue: Secondary | ICD-10-CM | POA: Diagnosis not present

## 2016-10-02 DIAGNOSIS — R52 Pain, unspecified: Secondary | ICD-10-CM | POA: Diagnosis not present

## 2016-10-02 DIAGNOSIS — R0981 Nasal congestion: Secondary | ICD-10-CM | POA: Insufficient documentation

## 2016-10-02 DIAGNOSIS — F17210 Nicotine dependence, cigarettes, uncomplicated: Secondary | ICD-10-CM | POA: Insufficient documentation

## 2016-10-02 LAB — CBC WITH DIFFERENTIAL/PLATELET
BASOS PCT: 0 %
Basophils Absolute: 0 10*3/uL (ref 0.0–0.1)
EOS ABS: 0.2 10*3/uL (ref 0.0–0.7)
Eosinophils Relative: 4 %
HEMATOCRIT: 34.8 % — AB (ref 36.0–46.0)
HEMOGLOBIN: 11.4 g/dL — AB (ref 12.0–15.0)
Lymphocytes Relative: 31 %
Lymphs Abs: 1.8 10*3/uL (ref 0.7–4.0)
MCH: 28.3 pg (ref 26.0–34.0)
MCHC: 32.8 g/dL (ref 30.0–36.0)
MCV: 86.4 fL (ref 78.0–100.0)
MONOS PCT: 12 %
Monocytes Absolute: 0.7 10*3/uL (ref 0.1–1.0)
NEUTROS ABS: 3.1 10*3/uL (ref 1.7–7.7)
NEUTROS PCT: 53 %
Platelets: 225 10*3/uL (ref 150–400)
RBC: 4.03 MIL/uL (ref 3.87–5.11)
RDW: 12.7 % (ref 11.5–15.5)
WBC: 5.9 10*3/uL (ref 4.0–10.5)

## 2016-10-02 LAB — COMPREHENSIVE METABOLIC PANEL
ALBUMIN: 3.7 g/dL (ref 3.5–5.0)
ALK PHOS: 46 U/L (ref 38–126)
ALT: 18 U/L (ref 14–54)
ANION GAP: 7 (ref 5–15)
AST: 32 U/L (ref 15–41)
BUN: 6 mg/dL (ref 6–20)
CALCIUM: 8.5 mg/dL — AB (ref 8.9–10.3)
CHLORIDE: 108 mmol/L (ref 101–111)
CO2: 22 mmol/L (ref 22–32)
Creatinine, Ser: 0.8 mg/dL (ref 0.44–1.00)
Glucose, Bld: 86 mg/dL (ref 65–99)
Potassium: 3.5 mmol/L (ref 3.5–5.1)
SODIUM: 137 mmol/L (ref 135–145)
Total Bilirubin: 0.8 mg/dL (ref 0.3–1.2)
Total Protein: 6.9 g/dL (ref 6.5–8.1)

## 2016-10-02 MED ORDER — IBUPROFEN 800 MG PO TABS
800.0000 mg | ORAL_TABLET | Freq: Once | ORAL | Status: AC
  Administered 2016-10-02: 800 mg via ORAL
  Filled 2016-10-02: qty 1

## 2016-10-02 NOTE — ED Notes (Signed)
Patient transported to X-ray 

## 2016-10-02 NOTE — ED Triage Notes (Signed)
Pt to ER for evaluation of cough, congestion x1 week with body aches. VSS. Lung sounds clear bilaterally.

## 2016-10-02 NOTE — Discharge Instructions (Signed)
Your chest Xray was normal. No pneumonia, pneumothorax, heart size normal. Your blood work was also normal.   This is an upper respiratory infection. Please treat symptomatically. You can use Sudafed over the counter for congestion and sinus pressure. Also take Ibuprofen for sinus pressure and headache. You can take  of ibuprofen every 6hrs for this. I also suggest Vitamin C packets which can help kick start your immune system for fighting off respiratory infection. For cough, I recommend Delsym cough medicine which is also over the counter.   Please return to the Emergency department if you have vomiting that will not stop or shortness of breath with your cough that is not improving or any new or worsening symptoms.

## 2016-10-02 NOTE — ED Provider Notes (Signed)
[facility]-EMERGENCY DEPT Provider Note   CSN: [id number] Arrival date & time: 10/02/16  0827     History   Chief Complaint Chief Complaint  Patient presents with  . Cough  . URI  . Generalized Body Aches    HPI Lisa Blevins is a 31 y.o. female.  HPI   Lisa Blevins is a 31yo female with no significant past medical history who presents to the Emergency Department for evaluation of "I think I have the flu." Patient states that she has had congestion, sinus pressure, productive cough, total body aches, fatigue for the past week. She is concerned because her symptoms have not improved. States that she has been taking OTC cold medicine with minimal relief. She states that her cough is productive of a yellow phlegm. Denies fever, chest pain, shortness of breath, abdominal pain, N/V/D. She states that she has young children at home who have been sick with upper respiratory infections over the past week as well. 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 She has not gotten a flu shot this year. Is able to eat and drink without difficulty.    History reviewed. No pertinent past medical history.  There are no active problems to display for this patient.   Past Surgical History:  Procedure Laterality Date  . arm surgery      OB History    No data available       Home Medications    Prior to Admission medications   Medication Sig Start Date End Date Taking? Authorizing Provider  ibuprofen (ADVIL,MOTRIN) 200 MG tablet Take 800 mg by mouth every 6 (six) hours as needed for headache or mild pain.   Yes [provider]  Phenylephrine-APAP-Guaifenesin (TYLENOL COLD & HEAD) 5-325-200 MG TABS Take 2 tablets by mouth daily as needed (for cold/ sinus).   Yes [provider]  terbinafine (LAMISIL) 250 MG tablet Take 250 mg by mouth daily. 09/15/16  Yes [provider]  guaiFENesin (ROBITUSSIN) 100  MG/5ML liquid Take 5-10 mLs (100-200 mg total) by mouth every 4 (four) hours as needed for cough or congestion. 10/19/15   Fayrene Helper, PA-C    Family History History reviewed. No pertinent family history.  Social History Social History  Substance Use Topics  . Smoking status: Current Every Day Smoker    Packs/day: 0.50    Types: Cigarettes  . Smokeless tobacco: Never Used  . Alcohol use No     Allergies   Patient has no known allergies.   Review of Systems Review of Systems  Constitutional: Positive for fatigue. Negative for chills and fever.  HENT: Positive for congestion, rhinorrhea and sinus pressure. Negative for ear discharge, ear pain, hearing loss and sore throat.   Eyes: Negative for discharge and visual disturbance.  Respiratory: Positive for cough. Negative for shortness of breath and wheezing.   Cardiovascular: Negative for chest pain.  Gastrointestinal: Negative for abdominal pain, diarrhea, nausea and vomiting.  Genitourinary: Negative for difficulty urinating.  Musculoskeletal: Positive for myalgias (total body).  Skin: Negative for rash and wound.  Neurological: Negative for headaches.  All other systems reviewed and are negative.    Physical Exam Updated Vital Signs BP 121/73   Pulse (!) 48   Temp 97.6 F (36.4 C) (Oral)   Resp 18   Ht  (1.803 m)   Wt 90.7 kg (200 lb)   SpO2 100%   BMI  27.89 kg/m   Physical Exam  Constitutional: She is oriented to person, place, and time. She appears well-developed and well-nourished. No distress.  HENT:  Head: Normocephalic and atraumatic.  Mouth/Throat: Oropharynx is clear and moist. No oropharyngeal exudate.  Rhinorrhea noted in the nasal cavity, clear. Mild tenderness to palpation over maxillary and frontal sinuses.   Eyes: Pupils are equal, round, and reactive to light. Conjunctivae are normal. Right eye exhibits no discharge. Left eye exhibits no discharge.  Neck: Normal range of motion. Neck  supple.  Cardiovascular: Normal rate, regular rhythm and intact distal pulses.  Exam reveals no friction rub.   No murmur heard. Pulmonary/Chest: Effort normal and breath sounds normal. No respiratory distress. She has no wheezes. She has no rales.  Abdominal: Soft. Bowel sounds are normal. There is no tenderness. There is no guarding.  Musculoskeletal: Normal range of motion.  Neurological: She is alert and oriented to person, place, and time. Coordination normal.  Skin: Skin is warm and dry. She is not diaphoretic.  Psychiatric: She has a normal mood and affect. Her behavior is normal.  Nursing note and vitals reviewed.    ED Treatments / Results  Labs (all labs ordered are listed, but only abnormal results are displayed) Labs Reviewed  COMPREHENSIVE METABOLIC PANEL - Abnormal; Notable for the following:       Result Value   Calcium 8.5 (*)    All other components within normal limits  CBC WITH DIFFERENTIAL/PLATELET - Abnormal; Notable for the following:    Hemoglobin 11.4 (*)    HCT 34.8 (*)    All other components within normal limits    EKG  EKG Interpretation None       Radiology Dg Chest 2 View  Result Date: 10/02/2016 CLINICAL DATA:  Cough and congestion for 1 week with body aches. EXAM: CHEST  2 VIEW COMPARISON:  None. FINDINGS: Normal heart size and mediastinal contours. No acute infiltrate or edema. No effusion or pneumothorax. No acute osseous findings. IMPRESSION: Negative chest. Electronically Signed   By: Marnee Spring M.D.   On: 10/02/2016 10:12    Procedures Procedures (including critical care time)  Medications Ordered in ED Medications  ibuprofen (ADVIL,MOTRIN) tablet 800 mg (800 mg Oral Given 10/02/16 1049)     Initial Impression / Assessment and Plan / ED Course  I have reviewed the triage vital signs and the nursing notes.  Pertinent labs & imaging results that were available during my care of the patient were reviewed by me and considered in  my medical decision making (see chart for details).     Pt CXR negative for acute infiltrate. Patients symptoms are consistent with URI, likely viral etiology. Discussed that antibiotics are not indicated for viral infections. Discussed that flu test would not change management given that she has had symptoms for a week now. Pt will be discharged with symptomatic treatment.  Verbalizes understanding and is agreeable with plan. Pt is hemodynamically stable & in NAD prior to dc.    Final Clinical Impressions(s) / ED Diagnoses   Final diagnoses:  Flu-like symptoms    New Prescriptions Discharge Medication List as of 10/02/2016 10:52 AM       Kellie Shropshire, PA-C 10/02/16 2100    Abelino Derrick, MD 10/04/16 0007

## 2017-02-14 NOTE — Congregational Nurse Program (Signed)
Congregational Nurse Program Note  Date of Encounter: 02/14/2017  Past Medical History: No past medical history on file.  Encounter Details: CNP Questionnaire - 02/14/17 1903      Questionnaire   Patient Status  Not Applicable    Race  Black or African American    Location Patient Served At  [employer]    Insurance  Medicaid    Uninsured  Not Applicable    Food  No food insecurities    Housing/Utilities  No permanent housing    Transportation  Yes, need transportation assistance    Interpersonal Safety  Yes, feel physically and emotionally safe where you currently live    Medication  No medication insecurities    Medical Provider  No    Referrals  Other    ED Visit Averted  Not Applicable    Life-Saving Intervention Made  Not Applicable      Encounter with client today was very enlighten. Only at [employer] until she can get her housing and other things together. Two small children. Talked with SWStudentIzell Pine( Daishya) about concerns and allowed me to take her Vital signs. Has no medical concerns at this time.Was admitted to hospital earlier for personal concerns. Bus Pass was given to help her with transportation.Will continue to follow up with her and family. [address]Arya Boxley RN BSN [facility] PhD. [phone] .

## 2017-07-08 ENCOUNTER — Emergency Department (HOSPITAL_COMMUNITY)
Admission: EM | Admit: 2017-07-08 | Discharge: 2017-07-08 | Disposition: A | Discharge status: Home / Self Care | Payer: Medicaid - Out of State | Attending: Emergency Medicine | Admitting: Emergency Medicine

## 2017-07-08 ENCOUNTER — Encounter (HOSPITAL_COMMUNITY): Payer: Self-pay | Admitting: Emergency Medicine

## 2017-07-08 ENCOUNTER — Emergency Department (HOSPITAL_COMMUNITY): Payer: Medicaid - Out of State

## 2017-07-08 DIAGNOSIS — M79644 Pain in right finger(s): Secondary | ICD-10-CM | POA: Insufficient documentation

## 2017-07-08 DIAGNOSIS — Z79899 Other long term (current) drug therapy: Secondary | ICD-10-CM | POA: Insufficient documentation

## 2017-07-08 DIAGNOSIS — M79646 Pain in unspecified finger(s): Secondary | ICD-10-CM

## 2017-07-08 DIAGNOSIS — F17210 Nicotine dependence, cigarettes, uncomplicated: Secondary | ICD-10-CM | POA: Insufficient documentation

## 2017-07-08 MED ORDER — CEPHALEXIN 500 MG PO CAPS: 500.0000 mg | ORAL_CAPSULE | Freq: Four times a day (QID) | ORAL | 0 refills | Status: AC

## 2017-07-08 NOTE — ED Triage Notes (Signed)
Reports a dark color to right pinky nail on hand two weeks ago and thirty minutes ago noticed a tingling to finger.

## 2017-07-08 NOTE — Discharge Instructions (Signed)
You were given a prescription for antibiotics. Please take the antibiotic prescription fully.   Please follow up with your primary care provider within 5-7 days for re-evaluation of your symptoms. If you do not have a primary care provider, information for a healthcare clinic has been provided for you to make arrangements for follow up care. Please return to the emergency department for any new or worsening symptoms.  

## 2017-07-08 NOTE — ED Provider Notes (Signed)
[facility] EMERGENCY DEPARTMENT Provider Note   CSN: [id number] Arrival date & time: 07/08/17  2012     History   Chief Complaint Chief Complaint  Patient presents with  . Hand Pain    HPI Manuel Lawhead is a 32 y.o. female.  HPI   32 year old female presenting to the emergency department complaining of right fifth finger pain that has been ongoing for the last week.  Notes a foreign body sensation to the right pinky finger.  States that there was discoloration to the pinky that looked "like a fungus ".  States that she wanted to come to the emergency room to make sure that there was not "a creature in there".  No fevers.  No drainage from the area.  No redness, warmth.  Pain is mild constant and worse with palpation.  Notes that she has a broken fingernail to the area.  History reviewed. No pertinent past medical history.  There are no active problems to display for this patient.   Past Surgical History:  Procedure Laterality Date  . arm surgery       OB History   None      Home Medications    Prior to Admission medications   Medication Sig Start Date End Date Taking? Authorizing Provider  cephALEXin (KEFLEX) 500 MG capsule Take 1 capsule (500 mg total) by mouth 4 (four) times daily for 5 days. 07/08/17 07/13/17  Braylie Badami S, PA-C  guaiFENesin (ROBITUSSIN) 100 MG/5ML liquid Take 5-10 mLs (100-200 mg total) by mouth every 4 (four) hours as needed for cough or congestion. 10/19/15   Fayrene Helper, PA-C  ibuprofen (ADVIL,MOTRIN) 200 MG tablet Take 800 mg by mouth every 6 (six) hours as needed for headache or mild pain.    [provider]  Phenylephrine-APAP-Guaifenesin (TYLENOL COLD & HEAD) 5-325-200 MG TABS Take 2 tablets by mouth daily as needed (for cold/ sinus).    [provider]  terbinafine (LAMISIL) 250 MG tablet Take 250 mg by mouth daily. 09/15/16   [provider]    Family History No family history on  file.  Social History Social History   Tobacco Use  . Smoking status: Current Every Day Smoker    Packs/day: 0.50    Types: Cigarettes  . Smokeless tobacco: Never Used  Substance Use Topics  . Alcohol use: No  . Drug use: No     Allergies   Patient has no known allergies.   Review of Systems Review of Systems  Constitutional: Negative for fever.  Musculoskeletal:       Finger pain  Skin: Positive for wound.     Physical Exam Updated Vital Signs BP 124/83 (BP Location: Right Arm)   Pulse 83   Temp 98.5 F (36.9 C) (Oral)   Resp 18   Ht 5\' 11"  (1.803 m)   Wt 98.9 kg (218 lb)   SpO2 98%   BMI 30.40 kg/m   Physical Exam  Constitutional: She appears well-developed and well-nourished. No distress.  HENT:  Head: Normocephalic and atraumatic.  Eyes: Conjunctivae are normal.  Neck: Neck supple.  Cardiovascular: Normal rate.  Pulmonary/Chest: Effort normal.  Musculoskeletal:  Right 5th fingernail has a small area that is broken to medial aspect. Appears to have a possible underlying pus collection. No active drainage but there is an opening under the nail. No FB noted. No TTP. No erythema or signs of cellulitis. Difficult to fully evaluate nail as there is nail polish on the  patient's finger.   Neurological: She is alert.  Skin: Skin is warm and dry.  Psychiatric: She has a normal mood and affect.  Nursing note and vitals reviewed.    ED Treatments / Results  Labs (all labs ordered are listed, but only abnormal results are displayed) Labs Reviewed - No data to display  EKG None  Radiology Dg Finger Little Right  Result Date: 07/08/2017 CLINICAL DATA:  Pain EXAM: RIGHT LITTLE FINGER 2+V COMPARISON:  None FINDINGS: Osseous mineralization normal. Joint spaces preserved. No fracture, dislocation, or bone destruction. IMPRESSION: Normal exam. Electronically Signed   By: Ulyses SouthwardMark  Boles M.D.   On: 07/08/2017 22:41    Procedures Procedures (including critical care  time)  Medications Ordered in ED Medications - No data to display   Initial Impression / Assessment and Plan / ED Course  I have reviewed the triage vital signs and the nursing notes.  Pertinent labs & imaging results that were available during my care of the patient were reviewed by me and considered in my medical decision making (see chart for details).     Final Clinical Impressions(s) / ED Diagnoses   Final diagnoses:  Pain of finger, unspecified laterality   Patient here complaining of right pinky finger foreign body sensation beneath the nailbed.  Patient worried that she has a FB or "creature" underneath her fingernail. VSS. No evidence of FB on exam. No evidence of FB on xray. Small break in the nail, no underlying nailbed laceration. Possible fluid collection that appears to have opening that will facilitate drainage of pus. Will give short course of abx and have pt f/u as an outpatient. Return precautions discussed. All questions answered.   ED Discharge Orders        Ordered    cephALEXin (KEFLEX) 500 MG capsule  4 times daily     07/08/17 [address]2310       Sunjai Levandoski S, PA-C 07/08/17 2340    Bethann BerkshireZammit, Joseph, MD 07/10/17 1031

## 2017-07-31 ENCOUNTER — Encounter (HOSPITAL_BASED_OUTPATIENT_CLINIC_OR_DEPARTMENT_OTHER): Payer: Self-pay | Admitting: *Deleted

## 2017-07-31 ENCOUNTER — Other Ambulatory Visit: Payer: Self-pay

## 2017-07-31 ENCOUNTER — Emergency Department (HOSPITAL_BASED_OUTPATIENT_CLINIC_OR_DEPARTMENT_OTHER)
Admission: EM | Admit: 2017-07-31 | Discharge: 2017-07-31 | Disposition: A | Discharge status: Home / Self Care | Payer: Medicaid - Out of State | Attending: Emergency Medicine | Admitting: Emergency Medicine

## 2017-07-31 DIAGNOSIS — F17210 Nicotine dependence, cigarettes, uncomplicated: Secondary | ICD-10-CM | POA: Insufficient documentation

## 2017-07-31 DIAGNOSIS — Y929 Unspecified place or not applicable: Secondary | ICD-10-CM | POA: Insufficient documentation

## 2017-07-31 DIAGNOSIS — Z23 Encounter for immunization: Secondary | ICD-10-CM | POA: Insufficient documentation

## 2017-07-31 DIAGNOSIS — Y990 Civilian activity done for income or pay: Secondary | ICD-10-CM | POA: Insufficient documentation

## 2017-07-31 DIAGNOSIS — S61210A Laceration without foreign body of right index finger without damage to nail, initial encounter: Secondary | ICD-10-CM | POA: Insufficient documentation

## 2017-07-31 DIAGNOSIS — Y93G1 Activity, food preparation and clean up: Secondary | ICD-10-CM | POA: Insufficient documentation

## 2017-07-31 DIAGNOSIS — W268XXA Contact with other sharp object(s), not elsewhere classified, initial encounter: Secondary | ICD-10-CM | POA: Insufficient documentation

## 2017-07-31 DIAGNOSIS — Z79899 Other long term (current) drug therapy: Secondary | ICD-10-CM | POA: Insufficient documentation

## 2017-07-31 MED ORDER — TETANUS-DIPHTH-ACELL PERTUSSIS 5-2.5-18.5 LF-MCG/0.5 IM SUSP
0.5000 mL | Freq: Once | INTRAMUSCULAR | Status: AC
  Administered 2017-07-31: 0.5 mL via INTRAMUSCULAR
  Filled 2017-07-31: qty 0.5

## 2017-07-31 MED ORDER — LIDOCAINE HCL 2 % IJ SOLN
20.0000 mL | Freq: Once | INTRAMUSCULAR | Status: AC
  Administered 2017-07-31: 400 mg via INTRADERMAL
  Filled 2017-07-31: qty 20

## 2017-07-31 NOTE — ED Notes (Signed)
ED Provider at bedside. 

## 2017-07-31 NOTE — ED Triage Notes (Signed)
Laceration to her right index finger. She was cleaning under the refrigerator and her hand hit a piece of metal. Bleeding controlled.

## 2017-07-31 NOTE — ED Provider Notes (Signed)
[facility] EMERGENCY DEPARTMENT Provider Note   CSN: [id number] Arrival date & time: 07/31/17  1426     History   Chief Complaint Chief Complaint  Patient presents with  . Laceration    HPI Lisa Blevins is a 32 y.o. female who presents with a R index finger laceration. No significant PMH. 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 The bleeding was difficult to control and she saw "fat" so she came to the ED. She denies weakness of the finger and does not have paresthesias. She is right handed. Tetanus is not up to date.  HPI  History reviewed. No pertinent past medical history.  There are no active problems to display for this patient.   Past Surgical History:  Procedure Laterality Date  . arm surgery       OB History   None      Home Medications    Prior to Admission medications   Medication Sig Start Date End Date Taking? Authorizing Provider  guaiFENesin (ROBITUSSIN) 100 MG/5ML liquid Take 5-10 mLs (100-200 mg total) by mouth every 4 (four) hours as needed for cough or congestion. 10/19/15   Fayrene Helperran, [place], PA-C  ibuprofen (ADVIL,MOTRIN) 200 MG tablet Take 800 mg by mouth every 6 (six) hours as needed for headache or mild pain.    [provider]  Phenylephrine-APAP-Guaifenesin (TYLENOL COLD & HEAD) 5-325-200 MG TABS Take 2 tablets by mouth daily as needed (for cold/ sinus).    [provider]  terbinafine (LAMISIL) 250 MG tablet Take 250 mg by mouth daily. 09/15/16   [provider]    Family History No family history on file.  Social History Social History   Tobacco Use  . Smoking status: Current Every Day Smoker    Packs/day: 0.50    Types: Cigarettes  . Smokeless tobacco: Never Used  Substance Use Topics  . Alcohol use: No  . Drug use: No     Allergies   Patient has no known allergies.   Review of Systems Review of Systems    Musculoskeletal: Positive for arthralgias.  Skin: Positive for wound.     Physical Exam Updated Vital Signs BP (!) 147/101 (BP Location: Left Arm)   Pulse 64   Temp 97.8 F (36.6 C) (Oral)   Resp 18   Ht 5\' 11"  (1.803 m)   Wt 98.9 kg (218 lb)   SpO2 100%   BMI 30.40 kg/m   Physical Exam  Constitutional: She is oriented to person, place, and time. She appears well-developed and well-nourished. No distress.  HENT:  Head: Normocephalic and atraumatic.  Eyes: Pupils are equal, round, and reactive to light. Conjunctivae are normal. Right eye exhibits no discharge. Left eye exhibits no discharge. No scleral icterus.  Neck: Normal range of motion.  Cardiovascular: Normal rate.  Pulmonary/Chest: Effort normal. No respiratory distress.  Abdominal: She exhibits no distension.  Musculoskeletal:  2cm laceration over the distal anterior right index finger. There is pulsating over the wound which suggests a small arterial bleed. 2+ radial pulse. FROM of PIP and DIP joint  Neurological: She is alert and oriented to person, place, and time.  Skin: Skin is warm and dry.  Psychiatric: She has a normal mood and affect. Her behavior is normal.  Nursing note and vitals reviewed.    ED Treatments / Results  Labs (all labs ordered are listed, but only abnormal  results are displayed) Labs Reviewed - No data to display  EKG None  Radiology No results found.  Procedures .Marland KitchenLaceration Repair Date/Time: 07/31/2017 4:36 PM Performed by: Bethel Born, PA-C Authorized by: Bethel Born, PA-C   Consent:    Consent obtained:  Verbal   Consent given by:  Patient   Risks discussed:  Infection and pain   Alternatives discussed:  No treatment Anesthesia (see MAR for exact dosages):    Anesthesia method:  Local infiltration   Local anesthetic:  Lidocaine 2% w/o epi Laceration details:    Location:  Finger   Finger location:  R index finger   Length (cm):  2   Depth (mm):   10 Repair type:    Repair type:  Simple Pre-procedure details:    Preparation:  Patient was prepped and draped in usual sterile fashion Exploration:    Hemostasis achieved with:  Tourniquet and direct pressure   Wound exploration: wound explored through full range of motion and entire depth of wound probed and visualized     Wound extent: vascular damage (small arterial bleed)     Wound extent: no tendon damage noted     Contaminated: no   Treatment:    Area cleansed with:  Shur-Clens   Amount of cleaning:  Standard   Irrigation volume:  30cc   Irrigation method:  Tap and pressure wash   Visualized foreign bodies/material removed: no   Skin repair:    Repair method:  Sutures   Suture size:  5-0   Suture material:  Prolene   Suture technique:  Simple interrupted   Number of sutures:  4 Approximation:    Approximation:  Close Post-procedure details:    Dressing:  Sterile dressing   Patient tolerance of procedure:  Tolerated well, no immediate complications   (including critical care time)    Medications Ordered in ED Medications  lidocaine (XYLOCAINE) 2 % (with pres) injection 400 mg (400 mg Intradermal Given 07/31/17 1625)  Tdap (BOOSTRIX) injection 0.5 mL (0.5 mLs Intramuscular Given 07/31/17 1625)     Initial Impression / Assessment and Plan / ED Course  I have reviewed the triage vital signs and the nursing notes.  Pertinent labs & imaging results that were available during my care of the patient were reviewed by me and considered in my medical decision making (see chart for details).  32 year old with right index finger laceration. It was repaired and irrigated in the ED. Bottom of the wound visualized and bleeding controlled. 4 sutures placed. There was still some bleeding after the stitches were placed. A pressure dressing was applied and she was advised to change this as needed. Wound care discussed and advised to return to have stitches removed in 7-10 days. Tdap was  updated. Return precautions discussed.    Final Clinical Impressions(s) / ED Diagnoses   Final diagnoses:  Laceration of right index finger without foreign body without damage to nail, initial encounter    ED Discharge Orders    None       Bethel Born, PA-C 07/31/17 1638    Mesner, Barbara Cower, MD 08/01/17 [id number]

## 2017-07-31 NOTE — Discharge Instructions (Signed)
Keep area clean by washing with soap and water daily. Apply a bandage at least once daily, change more often if it is dirty or soaks through Watch for signs of infection (redness, drainage, worsening pain) Have stitches removed in 7-10 days

## 2017-07-31 NOTE — ED Notes (Signed)
non adhesive bandage applied with a pressure coban dressing- pt given instructions to continue to dress lac.

## 2017-08-07 ENCOUNTER — Emergency Department (HOSPITAL_BASED_OUTPATIENT_CLINIC_OR_DEPARTMENT_OTHER)
Admission: EM | Admit: 2017-08-07 | Discharge: 2017-08-07 | Discharge status: Absent without leave | Payer: Medicaid - Out of State

## 2017-08-07 ENCOUNTER — Emergency Department (HOSPITAL_COMMUNITY): Admission: EM | Admit: 2017-08-07 | Payer: Medicaid Other | Source: Home / Self Care

## 2018-01-25 ENCOUNTER — Encounter (HOSPITAL_COMMUNITY): Payer: Self-pay

## 2018-01-25 ENCOUNTER — Emergency Department (HOSPITAL_COMMUNITY)
Admission: EM | Admit: 2018-01-25 | Discharge: 2018-01-25 | Disposition: A | Discharge status: Home / Self Care | Payer: Medicaid Other | Attending: Emergency Medicine | Admitting: Emergency Medicine

## 2018-01-25 ENCOUNTER — Other Ambulatory Visit: Payer: Self-pay

## 2018-01-25 ENCOUNTER — Emergency Department (HOSPITAL_COMMUNITY): Payer: Medicaid Other

## 2018-01-25 DIAGNOSIS — R05 Cough: Secondary | ICD-10-CM | POA: Diagnosis present

## 2018-01-25 DIAGNOSIS — Z79899 Other long term (current) drug therapy: Secondary | ICD-10-CM | POA: Insufficient documentation

## 2018-01-25 DIAGNOSIS — J069 Acute upper respiratory infection, unspecified: Secondary | ICD-10-CM

## 2018-01-25 DIAGNOSIS — F17210 Nicotine dependence, cigarettes, uncomplicated: Secondary | ICD-10-CM | POA: Insufficient documentation

## 2018-01-25 DIAGNOSIS — Z720 Tobacco use: Secondary | ICD-10-CM

## 2018-01-25 MED ORDER — OXYMETAZOLINE HCL 0.05 % NA SOLN
1.0000 | Freq: Once | NASAL | Status: AC
  Administered 2018-01-25: 1 via NASAL
  Filled 2018-01-25: qty 15

## 2018-01-25 MED ORDER — IBUPROFEN 600 MG PO TABS: 600.0000 mg | ORAL_TABLET | Freq: Four times a day (QID) | ORAL | 0 refills | Status: DC | PRN

## 2018-01-25 MED ORDER — AEROCHAMBER PLUS FLO-VU MEDIUM MISC
1.0000 | Freq: Once | Status: DC
  Filled 2018-01-25: qty 1

## 2018-01-25 MED ORDER — KETOROLAC TROMETHAMINE 30 MG/ML IJ SOLN
30.0000 mg | Freq: Once | INTRAMUSCULAR | Status: AC
  Administered 2018-01-25: 30 mg via INTRAMUSCULAR
  Filled 2018-01-25: qty 1

## 2018-01-25 MED ORDER — ALBUTEROL SULFATE HFA 108 (90 BASE) MCG/ACT IN AERS
1.0000 | INHALATION_SPRAY | RESPIRATORY_TRACT | Status: DC | PRN
  Filled 2018-01-25: qty 6.7

## 2018-01-25 MED ORDER — HYDROCODONE-ACETAMINOPHEN 5-325 MG PO TABS: 1.0000 | ORAL_TABLET | ORAL | 0 refills | Status: DC | PRN

## 2018-01-25 NOTE — ED Notes (Signed)
Pt discharged by other provider. 

## 2018-01-25 NOTE — ED Triage Notes (Signed)
Pt with c/o muscle aching, HA, sinus drainage, sputum productive with yellow/green sputum, sore chest from coughing x 5 days; pt states that she hasn't had flu shot.

## 2018-01-25 NOTE — ED Provider Notes (Signed)
[facility] EMERGENCY DEPARTMENT Provider Note   CSN: [id number] Arrival date & time: 01/25/18  1713     History   Chief Complaint Chief Complaint  Patient presents with  . Cough    Flu-Like S/S    HPI Lisa Blevins is a 33 y.o. female.  Pt presents to the Ed today with cough and cold sx.  She has had sx for 5 days.  She has not had her flu shot.  She has been taking ibuprofen for sx, but last took it yesterday.      History reviewed. No pertinent past medical history.  There are no active problems to display for this patient.   Past Surgical History:  Procedure Laterality Date  . arm surgery       OB History   No obstetric history on file.      Home Medications    Prior to Admission medications   Medication Sig Start Date End Date Taking? Authorizing Provider  guaiFENesin (ROBITUSSIN) 100 MG/5ML liquid Take 5-10 mLs (100-200 mg total) by mouth every 4 (four) hours as needed for cough or congestion. 10/19/15   Fayrene Helper, PA-C  HYDROcodone-acetaminophen (NORCO/VICODIN) 5-325 MG tablet Take 1 tablet by mouth every 4 (four) hours as needed. 01/25/18   Jacalyn Lefevre, MD  ibuprofen (ADVIL,MOTRIN) 600 MG tablet Take 1 tablet (600 mg total) by mouth every 6 (six) hours as needed. 01/25/18   Jacalyn Lefevre, MD  Phenylephrine-APAP-Guaifenesin (TYLENOL COLD & HEAD) 5-325-200 MG TABS Take 2 tablets by mouth daily as needed (for cold/ sinus).    [provider]  terbinafine (LAMISIL) 250 MG tablet Take 250 mg by mouth daily. 09/15/16   [provider]    Family History History reviewed. No pertinent family history.  Social History Social History   Tobacco Use  . Smoking status: Current Every Day Smoker    Packs/day: 0.50    Types: Cigarettes  . Smokeless tobacco: Never Used  Substance Use Topics  . Alcohol use: No  . Drug use: No     Allergies   Patient has no known allergies.   Review of Systems Review of Systems    Constitutional: Positive for fever.  HENT: Positive for congestion.   Respiratory: Positive for cough.   All other systems reviewed and are negative.    Physical Exam Updated Vital Signs BP (!) 145/85 (BP Location: Right Arm)   Pulse 83   Temp 97.7 F (36.5 C) (Oral)   Resp 20   SpO2 99%   Physical Exam Vitals signs and nursing note reviewed.  Constitutional:      Appearance: Normal appearance.  HENT:     Head: Normocephalic and atraumatic.     Right Ear: External ear normal.     Left Ear: External ear normal.     Nose: Congestion present.     Mouth/Throat:     Mouth: Mucous membranes are moist.     Pharynx: Oropharynx is clear.  Eyes:     Extraocular Movements: Extraocular movements intact.     Conjunctiva/sclera: Conjunctivae normal.     Pupils: Pupils are equal, round, and reactive to light.  Neck:     Musculoskeletal: Normal range of motion and neck supple.  Cardiovascular:     Rate and Rhythm: Normal rate and regular rhythm.     Pulses: Normal pulses.  Pulmonary:     Effort: Pulmonary effort is normal.     Breath sounds: Wheezing present.  Abdominal:  General: Abdomen is flat. Bowel sounds are normal.     Palpations: Abdomen is soft.  Musculoskeletal: Normal range of motion.  Skin:    General: Skin is warm and dry.     Capillary Refill: Capillary refill takes less than 2 seconds.  Neurological:     General: No focal deficit present.     Mental Status: She is alert and oriented to person, place, and time.  Psychiatric:        Mood and Affect: Mood normal.        Behavior: Behavior normal.        Thought Content: Thought content normal.        Judgment: Judgment normal.      ED Treatments / Results  Labs (all labs ordered are listed, but only abnormal results are displayed) Labs Reviewed - No data to display  EKG None  Radiology Dg Chest 2 View  Result Date: 01/25/2018 CLINICAL DATA:  Productive cough. Chest pain from coughing for the past  5 days. EXAM: CHEST - 2 VIEW COMPARISON:  10/02/2016. FINDINGS: The heart size and mediastinal contours are within normal limits. Both lungs are clear. The visualized skeletal structures are unremarkable. IMPRESSION: Normal examination. Electronically Signed   By: Beckie SaltsSteven  Reid M.D.   On: 01/25/2018 18:30    Procedures Procedures (including critical care time)  Medications Ordered in ED Medications  albuterol (PROVENTIL HFA;VENTOLIN HFA) 108 (90 Base) MCG/ACT inhaler 1-2 puff (has no administration in time range)  AEROCHAMBER PLUS FLO-VU MEDIUM MISC 1 each (has no administration in time range)  ketorolac (TORADOL) 30 MG/ML injection 30 mg (30 mg Intramuscular Given 01/25/18 1744)  oxymetazoline (AFRIN) 0.05 % nasal spray 1 spray (1 spray Each Nare Given 01/25/18 1744)     Initial Impression / Assessment and Plan / ED Course  I have reviewed the triage vital signs and the nursing notes.  Pertinent labs & imaging results that were available during my care of the patient were reviewed by me and considered in my medical decision making (see chart for details).  Pt will be given an albuterol inhaler/spacer prior to d/c.  She is given a rx for ibuprofen and lortab.  Return if worse.  Final Clinical Impressions(s) / ED Diagnoses   Final diagnoses:  Viral upper respiratory tract infection  Tobacco abuse    ED Discharge Orders         Ordered    ibuprofen (ADVIL,MOTRIN) 600 MG tablet  Every 6 hours PRN     01/25/18 1836    HYDROcodone-acetaminophen (NORCO/VICODIN) 5-325 MG tablet  Every 4 hours PRN     01/25/18 1836           Jacalyn LefevreHaviland, Zykeria Laguardia, MD 01/25/18 1837

## 2018-01-25 NOTE — Discharge Instructions (Signed)
Try to stop smoking. °

## 2018-01-25 NOTE — ED Notes (Signed)
Patient transported to X-ray 

## 2018-12-06 ENCOUNTER — Emergency Department (HOSPITAL_COMMUNITY)
Admission: EM | Admit: 2018-12-06 | Discharge: 2018-12-06 | Disposition: A | Discharge status: Home / Self Care | Payer: Medicaid Other | Attending: Emergency Medicine | Admitting: Emergency Medicine

## 2018-12-06 ENCOUNTER — Other Ambulatory Visit: Payer: Self-pay

## 2018-12-06 ENCOUNTER — Encounter (HOSPITAL_COMMUNITY): Payer: Self-pay | Admitting: Emergency Medicine

## 2018-12-06 DIAGNOSIS — Z79899 Other long term (current) drug therapy: Secondary | ICD-10-CM | POA: Insufficient documentation

## 2018-12-06 DIAGNOSIS — M545 Low back pain: Secondary | ICD-10-CM | POA: Diagnosis present

## 2018-12-06 DIAGNOSIS — F17210 Nicotine dependence, cigarettes, uncomplicated: Secondary | ICD-10-CM | POA: Diagnosis not present

## 2018-12-06 DIAGNOSIS — M5441 Lumbago with sciatica, right side: Secondary | ICD-10-CM

## 2018-12-06 MED ORDER — LIDOCAINE 5 % EX PTCH
1.0000 | MEDICATED_PATCH | CUTANEOUS | Status: DC
  Administered 2018-12-06: 1 via TRANSDERMAL
  Filled 2018-12-06 (×2): qty 1

## 2018-12-06 MED ORDER — METHOCARBAMOL 500 MG PO TABS: 500.0000 mg | ORAL_TABLET | Freq: Two times a day (BID) | ORAL | 0 refills | Status: DC

## 2018-12-06 MED ORDER — METHYLPREDNISOLONE 4 MG PO TBPK: ORAL_TABLET | ORAL | 0 refills | Status: DC

## 2018-12-06 MED ORDER — NAPROXEN 250 MG PO TABS
500.0000 mg | ORAL_TABLET | Freq: Once | ORAL | Status: AC
  Administered 2018-12-06: 500 mg via ORAL
  Filled 2018-12-06: qty 2

## 2018-12-06 MED ORDER — METHOCARBAMOL 500 MG PO TABS
500.0000 mg | ORAL_TABLET | Freq: Once | ORAL | Status: AC
  Administered 2018-12-06: 500 mg via ORAL
  Filled 2018-12-06: qty 1

## 2018-12-06 MED ORDER — ACETAMINOPHEN 500 MG PO TABS
1000.0000 mg | ORAL_TABLET | Freq: Once | ORAL | Status: AC
  Administered 2018-12-06: 1000 mg via ORAL
  Filled 2018-12-06: qty 2

## 2018-12-06 MED ORDER — NAPROXEN 500 MG PO TABS: 500.0000 mg | ORAL_TABLET | Freq: Two times a day (BID) | ORAL | 0 refills | Status: DC

## 2018-12-06 MED ORDER — PREDNISONE 20 MG PO TABS
60.0000 mg | ORAL_TABLET | Freq: Once | ORAL | Status: AC
  Administered 2018-12-06: 60 mg via ORAL
  Filled 2018-12-06: qty 3

## 2018-12-06 NOTE — ED Triage Notes (Signed)
Pt here from home with c/o low back pain worse on palpation

## 2018-12-06 NOTE — ED Provider Notes (Signed)
[facility] EMERGENCY DEPARTMENT Provider Note   CSN: [id number] Arrival date & time: 12/06/18  1015     History   Chief Complaint Chief Complaint  Patient presents with  . Back Pain    HPI Lisa Blevins is a 33 y.o. female.     Lisa Blevins is a 33 y.o. female who is otherwise healthy, presents to the ED for evaluation of low back pain.  Reports that the symptoms began yesterday.  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  She reports that pain is worse in her back with movement primarily with forward bending.  She reports pain is present on both sides but seems to be worse on the right and intermittently radiates into the right buttock.  Pain is worse with movement of the lower extremities.  She has not had any numbness or weakness in her lower extremities.  No loss of bowel or bladder control or saddle anesthesia.  No fevers or chills.  No urinary symptoms.  She took Tylenol and Motrin with slight improvement yesterday.  No history of surgeries or injury to the back.  No other aggravating or alleviating factors.     No past medical history on file.  There are no active problems to display for this patient.   Past Surgical History:  Procedure Laterality Date  . arm surgery       OB History   No obstetric history on file.      Home Medications    Prior to Admission medications   Medication Sig Start Date End Date Taking? Authorizing Provider  guaiFENesin (ROBITUSSIN) 100 MG/5ML liquid Take 5-10 mLs (100-200 mg total) by mouth every 4 (four) hours as needed for cough or congestion. 10/19/15   Domenic Moras, PA-C  HYDROcodone-acetaminophen (NORCO/VICODIN) 5-325 MG tablet Take 1 tablet by mouth every 4 (four) hours as needed. 01/25/18   Isla Pence, MD  ibuprofen (ADVIL,MOTRIN) 600 MG tablet Take 1 tablet (600 mg total) by  mouth every 6 (six) hours as needed. 01/25/18   Isla Pence, MD  methocarbamol (ROBAXIN) 500 MG tablet Take 1 tablet (500 mg total) by mouth 2 (two) times daily. 12/06/18   Jacqlyn Larsen, PA-C  methylPREDNISolone (MEDROL DOSEPAK) 4 MG TBPK tablet Take as directed 12/06/18   Jacqlyn Larsen, PA-C  naproxen (NAPROSYN) 500 MG tablet Take 1 tablet (500 mg total) by mouth 2 (two) times daily. 12/06/18   Jacqlyn Larsen, PA-C  Phenylephrine-APAP-Guaifenesin (TYLENOL COLD & HEAD) 5-325-200 MG TABS Take 2 tablets by mouth daily as needed (for cold/ sinus).    [provider]  terbinafine (LAMISIL) 250 MG tablet Take 250 mg by mouth daily. 09/15/16   [provider]    Family History No family history on file.  Social History Social History   Tobacco Use  . Smoking status: Current Every Day Smoker    Packs/day: 0.50    Types: Cigarettes  . Smokeless tobacco: Never Used  Substance Use Topics  . Alcohol use: No  . Drug use: No     Allergies   Patient has no known allergies.   Review of Systems Review of Systems  Constitutional: Negative for chills and fever.  HENT: Negative.   Respiratory: Negative for shortness of breath.   Cardiovascular: Negative for chest pain.  Gastrointestinal: Negative for abdominal pain, constipation, diarrhea, nausea  and vomiting.  Genitourinary: Negative for dysuria, flank pain, frequency and hematuria.  Musculoskeletal: Positive for back pain. Negative for arthralgias, gait problem, joint swelling, myalgias and neck pain.  Skin: Negative for color change, rash and wound.  Neurological: Negative for weakness and numbness.     Physical Exam Updated Vital Signs BP 120/79 (BP Location: Right Arm)   Pulse 76   Temp 98.5 F (36.9 C) (Oral)   Resp 20   SpO2 99%   Physical Exam Vitals signs and nursing note reviewed.  Constitutional:      General: She is not in acute distress.    Appearance: Normal appearance. She is well-developed and  normal weight. She is not diaphoretic.  HENT:     Head: Atraumatic.  Eyes:     General:        Right eye: No discharge.        Left eye: No discharge.  Neck:     Musculoskeletal: Neck supple.  Cardiovascular:     Pulses:          Radial pulses are 2+ on the right side and 2+ on the left side.       Dorsalis pedis pulses are 2+ on the right side and 2+ on the left side.       Posterior tibial pulses are 2+ on the right side and 2+ on the left side.  Pulmonary:     Effort: Pulmonary effort is normal. No respiratory distress.  Abdominal:     General: Bowel sounds are normal. There is no distension.     Palpations: Abdomen is soft. There is no mass.     Tenderness: There is no abdominal tenderness. There is no guarding.     Comments: Abdomen soft, nondistended, nontender to palpation in all quadrants without guarding or peritoneal signs, no CVA tenderness bilaterally  Musculoskeletal:     Comments: Tenderness to palpation over across the low back musculature.  Pain made worse with range of motion of the lower extremities, positive straight leg raise on the right.  Skin:    General: Skin is warm and dry.     Capillary Refill: Capillary refill takes less than 2 seconds.  Neurological:     Mental Status: She is alert and oriented to person, place, and time.     Comments: Alert, clear speech, following commands. Moving all extremities without difficulty. Bilateral lower extremities with 5/5 strength in proximal and distal muscle groups and with dorsi and plantar flexion. Sensation intact in bilateral lower extremities. 2+ patellar DTRs bilaterally. Ambulatory with steady gait  Psychiatric:        Mood and Affect: Mood normal.        Behavior: Behavior normal.      ED Treatments / Results  Labs (all labs ordered are listed, but only abnormal results are displayed) Labs Reviewed - No data to display  EKG None  Radiology No results found.  Procedures Procedures (including  critical care time)  Medications Ordered in ED Medications  lidocaine (LIDODERM) 5 % 1 patch (1 patch Transdermal Patch Applied 12/06/18 1119)  naproxen (NAPROSYN) tablet 500 mg (500 mg Oral Given 12/06/18 1117)  methocarbamol (ROBAXIN) tablet 500 mg (500 mg Oral Given 12/06/18 1117)  predniSONE (DELTASONE) tablet 60 mg (60 mg Oral Given 12/06/18 1117)  acetaminophen (TYLENOL) tablet 1,000 mg (1,000 mg Oral Given 12/06/18 1117)     Initial Impression / Assessment and Plan / ED Course  I have reviewed the triage vital signs and  the nursing notes.  Pertinent labs & imaging results that were available during my care of the patient were reviewed by me and considered in my medical decision making (see chart for details).  Patient with back pain.  No neurological deficits and normal neuro exam.  Patient can walk but states is painful.  No loss of bowel or bladder control.  No concern for cauda equina.  No fever, night sweats, weight loss, h/o cancer, IVDU.  RICE protocol and pain medicine indicated and discussed with patient.    Final Clinical Impressions(s) / ED Diagnoses   Final diagnoses:  Acute bilateral low back pain with right-sided sciatica    ED Discharge Orders         Ordered    naproxen (NAPROSYN) 500 MG tablet  2 times daily     12/06/18 1137    methocarbamol (ROBAXIN) 500 MG tablet  2 times daily     12/06/18 1137    methylPREDNISolone (MEDROL DOSEPAK) 4 MG TBPK tablet     12/06/18 1137           Jodi Geralds [place], [place] 12/06/18 1141    Derwood Kaplan, MD 12/07/18 1549

## 2018-12-06 NOTE — Discharge Instructions (Addendum)
You were seen here today for Back Pain: Low back pain is discomfort in the lower back that may be due to injuries to muscles and ligaments around the spine. Occasionally, it may be caused by a problem to a part of the spine called a disc. Your back pain should be treated with medicines listed below as well as back exercises and this back pain should get better over the next 2 weeks. Most patients get completely well in 4 weeks. It is important to know however, if you develop severe or worsening pain, low back pain with fever, numbness, weakness or inability to walk or urinate, you should return to the ER immediately.  Please follow up with your doctor this week for a recheck if still having symptoms.  HOME INSTRUCTIONS Self - care:  The application of heat can help soothe the pain.  Maintaining your daily activities, including walking (this is encouraged), as it will help you get better faster than just staying in bed. Do not life, push, pull anything more than 10 pounds for the next week. I am attaching back exercises that you can do at home to help facilitate your recovery.   Back Exercises - I have attached a handout on back exercises that can be done at home to help facilitate your recovery.   Medications are also useful to help with pain control.   Acetaminophen.  This medication is generally safe, and found over the counter. Take as directed for your age. You should not take more than 8 of the extra strength (500mg ) pills a day (max dose is 4000mg  total OVER one day)  Non steroidal anti inflammatory: This includes medications including Ibuprofen, naproxen and Mobic; These medications help both pain and swelling and are very useful in treating back pain.  They should be taken with food, as they can cause stomach upset, and more seriously, stomach bleeding. Do not combine the medications.   Lidocaine Patch: Salon Pas lidocaine patches (blue and silver box) can be purchased over the counter and worn  for 12 hours for local pain relief   Muscle relaxants:  These medications can help with muscle tightness that is a cause of lower back pain.  Most of these medications can cause drowsiness, and it is not safe to drive or use dangerous machinery while taking them. They are primarily helpful when taken at night before sleep.  Prednisone -  This is an oral steroid.  This medication is best taken with food in the morning.  Please note that this medication can cause anxiety, mood swings, muscle fatigue, increased hunger, weight gain (sodium/fluid retention), poor sleep as well as other symptoms. If you are a diabetic, please monitor your blood sugars at home as this medication can increase your blood sugars. Call your pharmacist if you have any questions.  You will need to follow up with your primary healthcare provider  or the Orthopedist in 1-2 weeks for reassessment and persistent symptoms.  Be aware that if you develop new symptoms, such as a fever, leg weakness, difficulty with or loss of control of your urine or bowels, abdominal pain, or more severe pain, you will need to seek medical attention and/or return to the Emergency department.  Additional Information:  Your vital signs today were: BP 120/79 (BP Location: Right Arm)    Pulse 76    Temp 98.5 F (36.9 C) (Oral)    Resp 20    SpO2 99%  If your blood pressure (BP) was elevated above  135/85 this visit, please have this repeated by your doctor within one month. ---------------

## 2018-12-06 NOTE — ED Notes (Signed)
Pt discharge instructions and prescriptions reviewed with the patient. Pt verbalized understanding of both. Pt discharged. 

## 2018-12-18 ENCOUNTER — Encounter (HOSPITAL_COMMUNITY): Payer: Self-pay | Admitting: Emergency Medicine

## 2018-12-18 ENCOUNTER — Emergency Department (HOSPITAL_COMMUNITY)
Admission: EM | Admit: 2018-12-18 | Discharge: 2018-12-18 | Discharge status: Against Medical Advice | Payer: Medicaid Other | Attending: Emergency Medicine | Admitting: Emergency Medicine

## 2018-12-18 ENCOUNTER — Other Ambulatory Visit: Payer: Self-pay

## 2018-12-18 DIAGNOSIS — Z5321 Procedure and treatment not carried out due to patient leaving prior to being seen by health care provider: Secondary | ICD-10-CM | POA: Diagnosis not present

## 2018-12-18 DIAGNOSIS — Z20828 Contact with and (suspected) exposure to other viral communicable diseases: Secondary | ICD-10-CM | POA: Diagnosis present

## 2018-12-18 DIAGNOSIS — Z20822 Contact with and (suspected) exposure to covid-19: Secondary | ICD-10-CM

## 2018-12-18 NOTE — ED Triage Notes (Signed)
Pt reports that she has been around a coworker who tested positive for COVID. Pt denies symptoms at this time, states she had a runny nose over the weekend.

## 2018-12-19 ENCOUNTER — Emergency Department (HOSPITAL_COMMUNITY)
Admission: EM | Admit: 2018-12-19 | Discharge: 2018-12-19 | Disposition: A | Discharge status: Home / Self Care | Payer: Medicaid Other | Attending: Emergency Medicine | Admitting: Emergency Medicine

## 2018-12-19 ENCOUNTER — Encounter (HOSPITAL_COMMUNITY): Payer: Self-pay | Admitting: Emergency Medicine

## 2018-12-19 DIAGNOSIS — R0981 Nasal congestion: Secondary | ICD-10-CM | POA: Insufficient documentation

## 2018-12-19 DIAGNOSIS — Z5321 Procedure and treatment not carried out due to patient leaving prior to being seen by health care provider: Secondary | ICD-10-CM | POA: Diagnosis not present

## 2018-12-19 NOTE — ED Notes (Signed)
Called pt to recheck vitals. No response and this tech does not see the pt in the waiting room.

## 2018-12-19 NOTE — ED Triage Notes (Signed)
Pt developed a stuffy nose this weekend. Pt states she has been around some people  at her work that tested positive for covid. Denies any other symptoms.

## 2018-12-19 NOTE — ED Notes (Signed)
No answer when called for room 

## 2018-12-20 ENCOUNTER — Other Ambulatory Visit: Payer: Self-pay

## 2018-12-20 DIAGNOSIS — Z20822 Contact with and (suspected) exposure to covid-19: Secondary | ICD-10-CM

## 2018-12-21 LAB — NOVEL CORONAVIRUS, NAA: SARS-CoV-2, NAA: NOT DETECTED

## 2019-04-12 ENCOUNTER — Ambulatory Visit: Payer: Self-pay

## 2019-10-13 ENCOUNTER — Other Ambulatory Visit: Payer: Self-pay

## 2019-10-13 ENCOUNTER — Encounter (HOSPITAL_COMMUNITY): Payer: Self-pay | Admitting: Emergency Medicine

## 2019-10-13 ENCOUNTER — Emergency Department (HOSPITAL_COMMUNITY)
Admission: EM | Admit: 2019-10-13 | Discharge: 2019-10-13 | Disposition: A | Discharge status: Home / Self Care | Payer: Medicaid Other | Attending: Emergency Medicine | Admitting: Emergency Medicine

## 2019-10-13 DIAGNOSIS — U071 COVID-19: Secondary | ICD-10-CM | POA: Diagnosis not present

## 2019-10-13 DIAGNOSIS — Z5321 Procedure and treatment not carried out due to patient leaving prior to being seen by health care provider: Secondary | ICD-10-CM | POA: Diagnosis not present

## 2019-10-13 DIAGNOSIS — R109 Unspecified abdominal pain: Secondary | ICD-10-CM | POA: Diagnosis not present

## 2019-10-13 DIAGNOSIS — J00 Acute nasopharyngitis [common cold]: Secondary | ICD-10-CM | POA: Diagnosis present

## 2019-10-13 LAB — COMPREHENSIVE METABOLIC PANEL
ALT: 18 U/L (ref 0–44)
AST: 29 U/L (ref 15–41)
Albumin: 3.3 g/dL — ABNORMAL LOW (ref 3.5–5.0)
Alkaline Phosphatase: 38 U/L (ref 38–126)
Anion gap: 9 (ref 5–15)
BUN: 7 mg/dL (ref 6–20)
CO2: 21 mmol/L — ABNORMAL LOW (ref 22–32)
Calcium: 8.2 mg/dL — ABNORMAL LOW (ref 8.9–10.3)
Chloride: 103 mmol/L (ref 98–111)
Creatinine, Ser: 1.1 mg/dL — ABNORMAL HIGH (ref 0.44–1.00)
GFR, Estimated: >60 mL/min (ref >60)
Glucose, Bld: 96 mg/dL (ref 70–99)
Potassium: 4.7 mmol/L (ref 3.5–5.1)
Sodium: 133 mmol/L — ABNORMAL LOW (ref 135–145)
Total Bilirubin: 0.3 mg/dL (ref 0.3–1.2)
Total Protein: 6.3 g/dL — ABNORMAL LOW (ref 6.5–8.1)

## 2019-10-13 LAB — CBC
HCT: 43 % (ref 36.0–46.0)
Hemoglobin: 14 g/dL (ref 12.0–15.0)
MCH: 28.8 pg (ref 26.0–34.0)
MCHC: 32.6 g/dL (ref 30.0–36.0)
MCV: 88.5 fL (ref 80.0–100.0)
Platelets: 225 10*3/uL (ref 150–400)
RBC: 4.86 MIL/uL (ref 3.87–5.11)
RDW: 13.8 % (ref 11.5–15.5)
WBC: 8.3 10*3/uL (ref 4.0–10.5)
nRBC: 0 % (ref 0.0–0.2)

## 2019-10-13 LAB — I-STAT BETA HCG BLOOD, ED (MC, WL, AP ONLY): I-stat hCG, quantitative: <5 m[IU]/mL (ref <5)

## 2019-10-13 LAB — RESPIRATORY PANEL BY RT PCR (FLU A&B, COVID)
Influenza A by PCR: NEGATIVE
Influenza B by PCR: NEGATIVE
SARS Coronavirus 2 by RT PCR: POSITIVE — AB

## 2019-10-13 LAB — LIPASE, BLOOD: Lipase: 24 U/L (ref 11–51)

## 2019-10-13 NOTE — ED Notes (Signed)
Pt called 2x no answer 

## 2019-10-13 NOTE — ED Triage Notes (Signed)
Pt has multiple complaints but starts with cold like symptoms for last 1 week has been feeling lightheaded and like she may pass out if she stands, abdomen pain , nausea.

## 2019-10-14 ENCOUNTER — Telehealth: Payer: Self-pay | Admitting: Nurse Practitioner

## 2019-10-14 ENCOUNTER — Other Ambulatory Visit: Payer: Self-pay

## 2019-10-14 ENCOUNTER — Other Ambulatory Visit: Payer: Self-pay | Admitting: Physician Assistant

## 2019-10-14 ENCOUNTER — Encounter (HOSPITAL_COMMUNITY): Payer: Self-pay | Admitting: Physician Assistant

## 2019-10-14 ENCOUNTER — Emergency Department (HOSPITAL_COMMUNITY)
Admission: EM | Admit: 2019-10-14 | Discharge: 2019-10-14 | Disposition: A | Discharge status: Home / Self Care | Payer: Medicaid Other | Attending: Emergency Medicine | Admitting: Emergency Medicine

## 2019-10-14 DIAGNOSIS — Z5900 Homelessness unspecified: Secondary | ICD-10-CM | POA: Insufficient documentation

## 2019-10-14 DIAGNOSIS — Z6830 Body mass index (BMI) 30.0-30.9, adult: Secondary | ICD-10-CM

## 2019-10-14 DIAGNOSIS — U071 COVID-19: Secondary | ICD-10-CM | POA: Diagnosis not present

## 2019-10-14 DIAGNOSIS — F17210 Nicotine dependence, cigarettes, uncomplicated: Secondary | ICD-10-CM | POA: Diagnosis not present

## 2019-10-14 DIAGNOSIS — R509 Fever, unspecified: Secondary | ICD-10-CM | POA: Diagnosis present

## 2019-10-14 HISTORY — DX: Body mass index (BMI) 30.0-30.9, adult: Z68.30

## 2019-10-14 HISTORY — DX: Homelessness unspecified: Z59.00

## 2019-10-14 MED ORDER — ALBUTEROL SULFATE HFA 108 (90 BASE) MCG/ACT IN AERS
2.0000 | INHALATION_SPRAY | Freq: Once | RESPIRATORY_TRACT | Status: AC
  Administered 2019-10-14: 2 via RESPIRATORY_TRACT
  Filled 2019-10-14: qty 6.7

## 2019-10-14 MED ORDER — DEXAMETHASONE SODIUM PHOSPHATE 10 MG/ML IJ SOLN
10.0000 mg | Freq: Once | INTRAMUSCULAR | Status: AC
  Administered 2019-10-14: 10 mg via INTRAMUSCULAR
  Filled 2019-10-14: qty 1

## 2019-10-14 NOTE — ED Provider Notes (Signed)
[facility] EMERGENCY DEPARTMENT Provider Note   CSN: [id number] Arrival date & time: 10/14/19  0847     History CC:  Viral symptoms  Lisa Blevins is a 34 y.o. female presenting to emergency department with viral URI type symptoms for the past 4 days.  Patient reports she has had fevers, chills, cough, shortness of breath, diarrhea, abdominal pain, headaches, muscle aches for the past 4 days.  He came to emergency department last night and had a Covid swab done in triage, but left prior to being seen because she had other obligations to get through.  Covid test subsequently came back positive.  Patient reports that she is vaccinated for Covid and received both doses of her vaccine.  She reports to me that she smokes about half a pack of cigarettes per day, but has no other lung problems or asthma problems.  She denies any other medical problems but reports that she is currently homeless, although she is staying with her acquaintance.  She does not have an active cell phone or home phone number.  She also reports she cannot afford any medications at all as an outpatient.    HPI     Past Medical History:  Diagnosis Date  . BMI 30.0-30.9,adult   . Homeless     Patient Active Problem List   Diagnosis Date Noted  . BMI 30.0-30.9,adult   . Homeless     Past Surgical History:  Procedure Laterality Date  . arm surgery       OB History   No obstetric history on file.     No family history on file.  Social History   Tobacco Use  . Smoking status: Current Every Day Smoker    Packs/day: 0.50    Types: Cigarettes  . Smokeless tobacco: Never Used  Substance Use Topics  . Alcohol use: No  . Drug use: No    Home Medications Prior to Admission medications   Medication Sig Start Date End Date Taking? Authorizing Provider  guaiFENesin (ROBITUSSIN) 100 MG/5ML liquid Take 5-10 mLs (100-200 mg total) by mouth every 4 (four) hours as needed for cough or  congestion. 10/19/15   Fayrene Helper, PA-C  HYDROcodone-acetaminophen (NORCO/VICODIN) 5-325 MG tablet Take 1 tablet by mouth every 4 (four) hours as needed. 01/25/18   Jacalyn Lefevre, MD  ibuprofen (ADVIL,MOTRIN) 600 MG tablet Take 1 tablet (600 mg total) by mouth every 6 (six) hours as needed. 01/25/18   Jacalyn Lefevre, MD  methocarbamol (ROBAXIN) 500 MG tablet Take 1 tablet (500 mg total) by mouth 2 (two) times daily. 12/06/18   Dartha Lodge, PA-C  methylPREDNISolone (MEDROL DOSEPAK) 4 MG TBPK tablet Take as directed 12/06/18   Dartha Lodge, PA-C  naproxen (NAPROSYN) 500 MG tablet Take 1 tablet (500 mg total) by mouth 2 (two) times daily. 12/06/18   Dartha Lodge, PA-C  Phenylephrine-APAP-Guaifenesin (TYLENOL COLD & HEAD) 5-325-200 MG TABS Take 2 tablets by mouth daily as needed (for cold/ sinus).    [provider]  terbinafine (LAMISIL) 250 MG tablet Take 250 mg by mouth daily. 09/15/16   [provider]    Allergies    Patient has no known allergies.  Review of Systems   Review of Systems  Constitutional: Positive for appetite change, chills, fatigue and fever.  HENT: Positive for congestion and sore throat.   Eyes: Negative for pain and visual disturbance.  Respiratory: Positive for cough and shortness of breath.   Cardiovascular: Negative for  chest pain and palpitations.  Gastrointestinal: Positive for abdominal pain and nausea. Negative for vomiting.  Genitourinary: Negative for dysuria and hematuria.  Musculoskeletal: Positive for arthralgias, back pain and myalgias.  Skin: Negative for color change and rash.  Neurological: Positive for light-headedness and headaches. Negative for seizures and syncope.  Psychiatric/Behavioral: Negative for agitation.  All other systems reviewed and are negative.   Physical Exam Updated Vital Signs BP 120/66 (BP Location: Right Arm)   Pulse 60   Temp 98.2 F (36.8 C) (Oral)   Resp 16   Ht 5\' 11"  (1.803 m)   Wt 99.8 kg    SpO2 100%   BMI 30.68 kg/m   Physical Exam Vitals and nursing note reviewed.  Constitutional:      General: She is not in acute distress.    Appearance: She is well-developed.  HENT:     Head: Normocephalic and atraumatic.  Eyes:     Conjunctiva/sclera: Conjunctivae normal.     Pupils: Pupils are equal, round, and reactive to light.  Cardiovascular:     Rate and Rhythm: Normal rate and regular rhythm.     Pulses: Normal pulses.  Pulmonary:     Effort: Pulmonary effort is normal. No respiratory distress.  Musculoskeletal:     Cervical back: Neck supple.  Skin:    General: Skin is warm and dry.  Neurological:     General: No focal deficit present.     Mental Status: She is alert and oriented to person, place, and time.  Psychiatric:        Mood and Affect: Mood normal.        Behavior: Behavior normal.     ED Results / Procedures / Treatments   Labs (all labs ordered are listed, but only abnormal results are displayed) Labs Reviewed - No data to display  EKG None  Radiology No results found.  Procedures Procedures (including critical care time)  Medications Ordered in ED Medications  albuterol (VENTOLIN HFA) 108 (90 Base) MCG/ACT inhaler 2 puff (2 puffs Inhalation Given 10/14/19 1240)  dexamethasone (DECADRON) injection 10 mg (10 mg Intramuscular Given 10/14/19 1240)    ED Course  I have reviewed the triage vital signs and the nursing notes.  Pertinent labs & imaging results that were available during my care of the patient were reviewed by me and considered in my medical decision making (see chart for details).  34 year old female here with Covid symptoms for the past 4 days.  She is vaccinated.  She has no hypoxia or acute respiratory distress on exam.  She is overall well-appearing.  We will treat her symptomatically with an albuterol pump, which she can go home with, as well as an IM dose of Decadron.  She unfortunately cannot afford any other medications.   I told her this was mostly symptomatic care, encourage her to keep drinking plenty of water.  She offered me her email is a contact number for monoclonal antibody infusions, if she is a candidate for this.  Our [facility] staff were able to get in touch with her to arrange for infusion tomorrow. I advised she quarantine until symptom free for 3 days, and talked about return precautions for worsening dyspnea, etc.  Kc Sedlak was evaluated in Emergency Department on 10/14/2019 for the symptoms described in the history of present illness. She was evaluated in the context of the global COVID-19 pandemic, which necessitated consideration that the patient might be at risk for infection with the SARS-CoV-2 virus  that causes COVID-19. Institutional protocols and algorithms that pertain to the evaluation of patients at risk for COVID-19 are in a state of rapid change based on information released by regulatory bodies including the CDC and federal and state organizations. These policies and algorithms were followed during the patient's care in the ED.      Final Clinical Impression(s) / ED Diagnoses Final diagnoses:  COVID-19    Rx / DC Orders ED Discharge Orders    None       Aleathia Purdy, Kermit Balo, MD 10/14/19 1807

## 2019-10-14 NOTE — Discharge Instructions (Addendum)
Lisa Blevins  My name is Lisa Blevins and I am a Advice worker at [facility]. We spoke earlier by phone.    We have contacted you because you have recently been diagnosed with COVID-19 with active symptoms of less than 10 days in duration and may benefit from a new treatment for mild to moderate disease. This treatment has been shown to reduce the risk of hospitalization and decrease the risk for severe symptoms related to COVID-19 in a select group of patients with risk factors / medical conditions we believe put people at a higher risk for this infection.    The Food and Drug Administration (FDA) has given emergency use of a new medication to treat those with mild to moderate symptoms who have risk factors that have been identified for severe symptoms related to COVID-19. This new treatment is a monoclonal antibody - the way it works its that it attaches like a magnet to the SARS-CoV2 virus (the virus that causes COVID-19) and stops it from infecting more cells in your body. It does not kill the virus but it prevents it from spreading throughout your body with the hope that it will decrease your symptoms after it is given.    This new medication is an intravenous (IV) infusion that is given over one half hour in our outpatient infusion clinic here in [place]. We will require you to stay roughly 60 minutes after the infusion to ensure you are tolerating it and watch for any allergic reaction to the medication. More information will also be given to you at the time of your appointment. There are two different medications. The medications are called Bamlanivimab & Etesevimab (by Olga Coaster) or Casirivimab & Imdevimab (by Regeneron).   The side effects that have been seen with this treatment: 2-4% of recipients experience nausea, vomiting, diarrhea, dizziness, headaches, itching, worsening fevers or chills for ~24 hours. There have been no serious infusion related reactions Of the over 3000  patients who received the infusion one did have an allergic response that resolved once the infusion was stopped - this is why we monitor all of our patients closely for an hour after the infusion.  The covid 19 vaccine (including boosters) must be delayed at least 90 days after receiving this infusion.  The medication itself is free, but your insurance will be charged an infusion fee. The amount you may owe later varies from insurance to insurance. If you do not have insurance, we can put you in touch with our billing department. The CMS code is M0244.  You have been scheduled to receive bamlanivimab/etesevimab or casirivimab/imdevimab (the monoclonal antibody we discussed) on: 10/15/19 @ 9:30am  The address for the infusion clinic site is:  --GPS address is [address] - the parking is located near [employer] building where you will see a COVID19 Infusion feather banner marking the entrance to parking. Please enter into the 2nd entrance where the "wave, flag banner" is at the road. Turn into this 2nd entrance and immediately turn left to park in 1 of 5 parking spots. These are all pictured below.                --Park in one of the marked spaces and call the front desk for assistance inside [phone]. STAY IN YOUR CAR. YOU WILL BE ESCORTED INTO THE BUILDING.    --Average time in department is roughly 2 hours for Regeneron treatment - this includes taking your vital signs, preparation of  the medication, IV start and the required 1 hour monitoring after the infusion.      Should you develop worsening shortness of breath, chest pain or severe breathing problems please do not wait for this appointment and go to the Emergency room for evaluation and treatment.   The day of your visit you should:  Get plenty of rest the night before and drink plenty of water  Eat a light meal/snack before coming and take your medications as prescribed   Wear warm, comfortable clothes with a shirt  that can roll-up over the elbow (will need IV start).   Wear a mask   Consider bringing some activity to help pass the time  If you were tested outside of [facility], please bring in a copy of your positive test or a photo of your home test or ask the clinic to fax to [phone].   I hope this helps find you feeling better,  Lisa Jews PA-C

## 2019-10-14 NOTE — Progress Notes (Signed)
I connected by phone with [name] on 10/14/2019 at 1:03 PM to discuss the potential use of a new treatment for mild to moderate COVID-19 viral infection in non-hospitalized patients.  This patient is a 34 y.o. female that meets the FDA criteria for Emergency Use Authorization of COVID monoclonal antibody casirivimab/imdevimab or bamlamivimab/estevimab.  Has a (+) direct SARS-CoV-2 viral test result  Has mild or moderate COVID-19   Is NOT hospitalized due to COVID-19  Is within 10 days of symptom onset  Has at least one of the high risk factor(s) for progression to severe COVID-19 and/or hospitalization as defined in EUA.  Specific high risk criteria : BMI > 25 and Other high risk medical condition per CDC:  socially vulnerable population   I have spoken and communicated the following to the patient or parent/caregiver regarding COVID monoclonal antibody treatment:  1. FDA has authorized the emergency use for the treatment of mild to moderate COVID-19 in adults and pediatric patients with positive results of direct SARS-CoV-2 viral testing who are 81 years of age and older weighing at least 40 kg, and who are at high risk for progressing to severe COVID-19 and/or hospitalization.  2. The significant known and potential risks and benefits of COVID monoclonal antibody, and the extent to which such potential risks and benefits are unknown.  3. Information on available alternative treatments and the risks and benefits of those alternatives, including clinical trials.  4. Patients treated with COVID monoclonal antibody should continue to self-isolate and use infection control measures (e.g., wear mask, isolate, social distance, avoid sharing personal items, clean and disinfect "high touch" surfaces, and frequent handwashing) according to CDC guidelines.   5. The patient or parent/caregiver has the option to accept or refuse COVID monoclonal antibody treatment.  After reviewing this  information with the patient, the patient has agreed to receive one of the available covid 19 monoclonal antibodies and will be provided an appropriate fact sheet prior to infusion.  Sx onset 10/8. Set up for infusion on 10/13 @ 9:30am . Directions given to [facility]. Pt is aware that insurance will be charged an infusion fee. Pt is fully vaccinated.   Cline Crock 10/14/2019 1:03 PM

## 2019-10-14 NOTE — ED Triage Notes (Signed)
Pt here with cold like symptoms for approx 1 week. Had covid test yesterday but left before being seen.

## 2019-10-14 NOTE — Telephone Encounter (Signed)
Chart review performed for evaluation for monoclonal antibody treatment for COVID-19 virus.  Chart review reveals patient is currently in the emergency room at [facility].  No notes are available at this time to determine if admission is planned.  I will document patient is unavailable for treatment at this time.

## 2019-10-15 ENCOUNTER — Ambulatory Visit (HOSPITAL_COMMUNITY): Payer: Medicaid Other

## 2019-10-27 ENCOUNTER — Other Ambulatory Visit: Payer: Self-pay

## 2019-10-28 ENCOUNTER — Other Ambulatory Visit: Payer: Medicaid Other

## 2019-10-28 DIAGNOSIS — Z20822 Contact with and (suspected) exposure to covid-19: Secondary | ICD-10-CM

## 2019-10-29 LAB — NOVEL CORONAVIRUS, NAA: SARS-CoV-2, NAA: NOT DETECTED

## 2019-10-29 LAB — SARS-COV-2, NAA 2 DAY TAT

## 2019-11-24 ENCOUNTER — Other Ambulatory Visit: Payer: Medicaid Other

## 2019-11-24 DIAGNOSIS — Z20822 Contact with and (suspected) exposure to covid-19: Secondary | ICD-10-CM

## 2019-11-26 LAB — SARS-COV-2, NAA 2 DAY TAT

## 2019-11-26 LAB — NOVEL CORONAVIRUS, NAA: SARS-CoV-2, NAA: NOT DETECTED

## 2020-05-10 ENCOUNTER — Emergency Department (HOSPITAL_COMMUNITY): Payer: Medicaid Other

## 2020-05-10 ENCOUNTER — Emergency Department (HOSPITAL_COMMUNITY)
Admission: EM | Admit: 2020-05-10 | Discharge: 2020-05-10 | Disposition: A | Discharge status: Home / Self Care | Payer: Medicaid Other | Attending: Emergency Medicine | Admitting: Emergency Medicine

## 2020-05-10 ENCOUNTER — Other Ambulatory Visit: Payer: Self-pay

## 2020-05-10 DIAGNOSIS — F17210 Nicotine dependence, cigarettes, uncomplicated: Secondary | ICD-10-CM | POA: Diagnosis not present

## 2020-05-10 DIAGNOSIS — S62316A Displaced fracture of base of fifth metacarpal bone, right hand, initial encounter for closed fracture: Secondary | ICD-10-CM | POA: Insufficient documentation

## 2020-05-10 DIAGNOSIS — M25561 Pain in right knee: Secondary | ICD-10-CM | POA: Insufficient documentation

## 2020-05-10 DIAGNOSIS — W228XXA Striking against or struck by other objects, initial encounter: Secondary | ICD-10-CM | POA: Diagnosis not present

## 2020-05-10 DIAGNOSIS — M25562 Pain in left knee: Secondary | ICD-10-CM | POA: Insufficient documentation

## 2020-05-10 DIAGNOSIS — S6291XA Unspecified fracture of right wrist and hand, initial encounter for closed fracture: Secondary | ICD-10-CM

## 2020-05-10 DIAGNOSIS — M549 Dorsalgia, unspecified: Secondary | ICD-10-CM

## 2020-05-10 DIAGNOSIS — S6991XA Unspecified injury of right wrist, hand and finger(s), initial encounter: Secondary | ICD-10-CM | POA: Diagnosis present

## 2020-05-10 MED ORDER — IBUPROFEN 600 MG PO TABS: 600.0000 mg | ORAL_TABLET | Freq: Four times a day (QID) | ORAL | 0 refills | Status: DC | PRN

## 2020-05-10 MED ORDER — OXYCODONE-ACETAMINOPHEN 5-325 MG PO TABS
1.0000 | ORAL_TABLET | Freq: Once | ORAL | Status: AC
  Administered 2020-05-10: 1 via ORAL
  Filled 2020-05-10: qty 1

## 2020-05-10 NOTE — Progress Notes (Signed)
Orthopedic Tech Progress Note Patient Details:  Lisa Blevins 11-Jan-1985 [id number]  Ortho Devices Type of Ortho Device: Ulna gutter splint,Arm sling Ortho Device/Splint Location: rue. Ortho Device/Splint Interventions: Ordered,Adjustment,Application  As soon as I finished applying the splint the patient grabbed it and started trying to spin it around on her arm. I asked her to stop and told her that it need to stay in the position I had applied it. I checked the splint and it was still in the right position.  Post Interventions Patient Tolerated: Well Instructions Provided: Care of device,Adjustment of device   Trinna Post 05/10/2020, 8:43 PM

## 2020-05-10 NOTE — Discharge Instructions (Addendum)
Follow up with hand surgery. Take tylenol or motrin for pain control. Recommend no weight bearing until cleared by hand surgery. Keep splint clean and dry.
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
## 2020-05-10 NOTE — ED Notes (Signed)
Patient transported to X-ray 

## 2020-05-10 NOTE — ED Notes (Signed)
Waiting on PA.
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
## 2020-05-10 NOTE — ED Notes (Signed)
All appropriate discharge materials reviewed at length with patient. Time for questions provided. Pt has no other questions at this time and verbalizes understanding of all provided materials.  
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
## 2020-10-27 ENCOUNTER — Encounter (HOSPITAL_COMMUNITY): Payer: Self-pay | Admitting: Emergency Medicine

## 2020-10-27 ENCOUNTER — Emergency Department (HOSPITAL_COMMUNITY)
Admission: EM | Admit: 2020-10-27 | Discharge: 2020-10-28 | Disposition: A | Discharge status: Home / Self Care | Payer: Medicaid Other | Attending: Emergency Medicine | Admitting: Emergency Medicine

## 2020-10-27 ENCOUNTER — Emergency Department (HOSPITAL_COMMUNITY): Payer: Medicaid Other

## 2020-10-27 ENCOUNTER — Other Ambulatory Visit: Payer: Self-pay

## 2020-10-27 DIAGNOSIS — N200 Calculus of kidney: Secondary | ICD-10-CM

## 2020-10-27 DIAGNOSIS — M5136 Other intervertebral disc degeneration, lumbar region: Secondary | ICD-10-CM

## 2020-10-27 DIAGNOSIS — M549 Dorsalgia, unspecified: Secondary | ICD-10-CM | POA: Diagnosis present

## 2020-10-27 DIAGNOSIS — M545 Low back pain, unspecified: Secondary | ICD-10-CM | POA: Insufficient documentation

## 2020-10-27 DIAGNOSIS — W19XXXA Unspecified fall, initial encounter: Secondary | ICD-10-CM

## 2020-10-27 DIAGNOSIS — N209 Urinary calculus, unspecified: Secondary | ICD-10-CM | POA: Diagnosis not present

## 2020-10-27 LAB — URINALYSIS, ROUTINE W REFLEX MICROSCOPIC
Bilirubin Urine: NEGATIVE
Glucose, UA: NEGATIVE mg/dL
Hgb urine dipstick: NEGATIVE
Ketones, ur: NEGATIVE mg/dL
Leukocytes,Ua: NEGATIVE
Nitrite: NEGATIVE
Protein, ur: NEGATIVE mg/dL
Specific Gravity, Urine: 1.024 (ref 1.005–1.030)
pH: 5 (ref 5.0–8.0)

## 2020-10-27 LAB — I-STAT BETA HCG BLOOD, ED (MC, WL, AP ONLY): I-stat hCG, quantitative: <5 m[IU]/mL (ref <5)
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
## 2020-10-28 ENCOUNTER — Encounter (HOSPITAL_COMMUNITY): Payer: Self-pay | Admitting: Emergency Medicine

## 2020-10-28 ENCOUNTER — Other Ambulatory Visit: Payer: Self-pay

## 2020-10-28 ENCOUNTER — Emergency Department (HOSPITAL_COMMUNITY)
Admission: EM | Admit: 2020-10-28 | Discharge: 2020-10-29 | Disposition: A | Discharge status: Home / Self Care | Payer: Medicaid Other | Attending: Emergency Medicine | Admitting: Emergency Medicine

## 2020-10-28 DIAGNOSIS — Z5321 Procedure and treatment not carried out due to patient leaving prior to being seen by health care provider: Secondary | ICD-10-CM | POA: Insufficient documentation

## 2020-10-28 DIAGNOSIS — M549 Dorsalgia, unspecified: Secondary | ICD-10-CM | POA: Insufficient documentation

## 2020-10-28 MED ORDER — METHOCARBAMOL 500 MG PO TABS: 500.0000 mg | ORAL_TABLET | Freq: Two times a day (BID) | ORAL | 0 refills | Status: DC

## 2020-10-28 MED ORDER — METHOCARBAMOL 500 MG PO TABS
500.0000 mg | ORAL_TABLET | Freq: Once | ORAL | Status: AC
  Administered 2020-10-28: 500 mg via ORAL
  Filled 2020-10-28: qty 1

## 2020-10-28 NOTE — Discharge Instructions (Addendum)
Thank you for allowing me to care for you today in the Emergency Department.   Your CT did show a bulging disc in your lumbar spine and a kidney stone (nephrolithiasis) in your right kidney, but these are incidental findings and do not seem related to the pain that brought you to the emergency department today.  For your back pain: Take 650 mg of Tylenol or 600 mg of ibuprofen with food every 6 hours for pain.  You can alternate between these 2 medications every 3 hours if your pain returns.  For instance, you can take Tylenol at noon, followed by a dose of ibuprofen at 3, followed by second dose of Tylenol and 6.  You can take 1 dose of Robaxin 2 times daily as needed for muscle pain and spasms.  Do not take other medications or substances, such as alcohol, that may make you drowsy while you are taking this medication.  Do not work or drive until you know how it impacts you while you are taking Robaxin.  You can apply heating pads or ice packs for 15 to 20 minutes up to 3-4 times a day for the next 5 days to help with your symptoms.  Try to stretch as your pain allows.  Make sure that when you are lifting, that you use good lifting techniques and lift with your legs and not with your back. I have attached additional information on using good lifting techniques to prevent back injuries.  Return to the emergency department if you become unable to walk, if you have numbness in your groin, if you have any fall or injury, feel unsafe at home, or have other new, concerning symptoms.

## 2020-10-28 NOTE — ED Triage Notes (Signed)
Pt c/o continued back pain. States she was seen yesterday and unable to fill her muscle relaxer rx. Ambulatory without difficulty.
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
## 2020-10-29 NOTE — ED Notes (Signed)
Called pt x3 for vitals, no response. 

## 2020-10-29 NOTE — ED Notes (Signed)
No answer when called for a room x2

## 2021-01-06 ENCOUNTER — Other Ambulatory Visit: Payer: Self-pay

## 2021-01-06 ENCOUNTER — Emergency Department (HOSPITAL_COMMUNITY)
Admission: EM | Admit: 2021-01-06 | Discharge: 2021-01-07 | Disposition: A | Discharge status: Home / Self Care | Payer: Medicaid Other | Attending: Student | Admitting: Student

## 2021-01-06 DIAGNOSIS — R051 Acute cough: Secondary | ICD-10-CM

## 2021-01-06 DIAGNOSIS — R062 Wheezing: Secondary | ICD-10-CM | POA: Insufficient documentation

## 2021-01-06 DIAGNOSIS — R059 Cough, unspecified: Secondary | ICD-10-CM | POA: Diagnosis present

## 2021-01-06 DIAGNOSIS — Z20822 Contact with and (suspected) exposure to covid-19: Secondary | ICD-10-CM | POA: Diagnosis not present

## 2021-01-06 LAB — RESP PANEL BY RT-PCR (FLU A&B, COVID) ARPGX2
Influenza A by PCR: NEGATIVE
Influenza B by PCR: NEGATIVE
SARS Coronavirus 2 by RT PCR: NEGATIVE

## 2021-01-06 NOTE — ED Triage Notes (Signed)
Pt reports non productive cough x 4 days. Denies sick contacts.

## 2021-01-07 ENCOUNTER — Emergency Department (HOSPITAL_COMMUNITY): Payer: Medicaid Other

## 2021-01-07 MED ORDER — BENZONATATE 100 MG PO CAPS: 100.0000 mg | ORAL_CAPSULE | Freq: Three times a day (TID) | ORAL | 0 refills | Status: DC

## 2021-01-07 MED ORDER — ALBUTEROL SULFATE HFA 108 (90 BASE) MCG/ACT IN AERS
2.0000 | INHALATION_SPRAY | Freq: Once | RESPIRATORY_TRACT | Status: AC
  Administered 2021-01-07: 2 via RESPIRATORY_TRACT
  Filled 2021-01-07: qty 6.7

## 2021-01-07 MED ORDER — IBUPROFEN 400 MG PO TABS
600.0000 mg | ORAL_TABLET | Freq: Once | ORAL | Status: AC
  Administered 2021-01-07: 600 mg via ORAL
  Filled 2021-01-07: qty 1

## 2021-01-07 NOTE — ED Provider Notes (Signed)
[facility] EMERGENCY DEPARTMENT Provider Note   CSN: [id number] Arrival date & time: 01/06/21  1548     History  Chief Complaint  Patient presents with   Cough    Lisa Blevins is a 36 y.o. female who presents emergency department for evaluation of a cough.  She states that she has had a cough for approximately 4 days and is worried that she has pneumonia.  Denies chest pain, abdominal pain, nausea, vomiting or other systemic symptoms.  Denies fever.  Cough is nonproductive.   Cough     Home Medications Prior to Admission medications   Medication Sig Start Date End Date Taking? Authorizing Provider  guaiFENesin (ROBITUSSIN) 100 MG/5ML liquid Take 5-10 mLs (100-200 mg total) by mouth every 4 (four) hours as needed for cough or congestion. 10/19/15   Fayrene Helper, PA-C  HYDROcodone-acetaminophen (NORCO/VICODIN) 5-325 MG tablet Take 1 tablet by mouth every 4 (four) hours as needed. 01/25/18   Jacalyn Lefevre, MD  ibuprofen (ADVIL) 600 MG tablet Take 1 tablet (600 mg total) by mouth every 6 (six) hours as needed. 05/10/20   [name], MD  methocarbamol (ROBAXIN) 500 MG tablet Take 1 tablet (500 mg total) by mouth 2 (two) times daily. 10/28/20   McDonald, Mia A, PA-C  methylPREDNISolone (MEDROL DOSEPAK) 4 MG TBPK tablet Take as directed 12/06/18   Dartha Lodge, PA-C  naproxen (NAPROSYN) 500 MG tablet Take 1 tablet (500 mg total) by mouth 2 (two) times daily. 12/06/18   Dartha Lodge, PA-C  Phenylephrine-APAP-Guaifenesin (TYLENOL COLD & HEAD) 5-325-200 MG TABS Take 2 tablets by mouth daily as needed (for cold/ sinus).    [provider]  terbinafine (LAMISIL) 250 MG tablet Take 250 mg by mouth daily. 09/15/16   [provider]      Allergies    Patient has no known allergies.    Review of Systems   Review of Systems  Respiratory:  Positive for cough.    Physical Exam Updated Vital Signs BP 138/73    Pulse 74    Temp 98.5 F (36.9 C)     Resp 17    SpO2 100%  Physical Exam Vitals and nursing note reviewed.  Constitutional:      General: She is not in acute distress.    Appearance: She is well-developed.  HENT:     Head: Normocephalic and atraumatic.  Eyes:     Conjunctiva/sclera: Conjunctivae normal.  Cardiovascular:     Rate and Rhythm: Normal rate and regular rhythm.     Heart sounds: No murmur heard. Pulmonary:     Effort: Pulmonary effort is normal. No respiratory distress.     Breath sounds: Wheezing present.  Abdominal:     Palpations: Abdomen is soft.     Tenderness: There is no abdominal tenderness.  Musculoskeletal:        General: No swelling.     Cervical back: Neck supple.  Skin:    General: Skin is warm and dry.     Capillary Refill: Capillary refill takes less than 2 seconds.  Neurological:     Mental Status: She is alert.  Psychiatric:        Mood and Affect: Mood normal.    ED Results / Procedures / Treatments   Labs (all labs ordered are listed, but only abnormal results are displayed) Labs Reviewed  RESP PANEL BY RT-PCR (FLU A&B, COVID) ARPGX2    EKG None  Radiology No results found.  Procedures Procedures    Medications Ordered in ED Medications  ibuprofen (ADVIL) tablet 600 mg (has no administration in time range)  albuterol (VENTOLIN HFA) 108 (90 Base) MCG/ACT inhaler 2 puff (2 puffs Inhalation Given 01/07/21 0303)    ED Course/ Medical Decision Making/ A&P                           Medical Decision Making  Patient seen in the emergency department for evaluation of a cough.  Physical exam reveals very mild and expiratory wheezing.  Chest x-ray with no pneumonia.  COVID and flu negative.  Patient received a albuterol inhaler here in the emergency department which improved her wheezing.  We did discussion about social determinants of health and the patient's homeless status does put her at risk for pneumonia and I worried that she may be unable to fill her meds.  Thus she  was given the albuterol inhaler here in the emergency department and a good Rx coupon was printed for the [employer].  Patient vital stable on discharge and she was discharged.         Final Clinical Impression(s) / ED Diagnoses Final diagnoses:  None    Rx / DC Orders ED Discharge Orders     None         Ladislav Caselli, Wyn Forster, MD 01/07/21 [phone]

## 2021-01-28 ENCOUNTER — Emergency Department (HOSPITAL_COMMUNITY)
Admission: EM | Admit: 2021-01-28 | Discharge: 2021-01-28 | Disposition: A | Discharge status: Home / Self Care | Payer: Medicaid Other | Attending: Emergency Medicine | Admitting: Emergency Medicine

## 2021-01-28 ENCOUNTER — Encounter (HOSPITAL_COMMUNITY): Payer: Self-pay | Admitting: *Deleted

## 2021-01-28 ENCOUNTER — Other Ambulatory Visit: Payer: Self-pay

## 2021-01-28 DIAGNOSIS — M25562 Pain in left knee: Secondary | ICD-10-CM | POA: Insufficient documentation

## 2021-01-28 DIAGNOSIS — G8929 Other chronic pain: Secondary | ICD-10-CM | POA: Insufficient documentation

## 2021-01-28 DIAGNOSIS — M549 Dorsalgia, unspecified: Secondary | ICD-10-CM | POA: Diagnosis present

## 2021-01-28 DIAGNOSIS — M545 Low back pain, unspecified: Secondary | ICD-10-CM | POA: Diagnosis not present

## 2021-01-28 MED ORDER — DEXAMETHASONE SODIUM PHOSPHATE 10 MG/ML IJ SOLN
10.0000 mg | Freq: Once | INTRAMUSCULAR | Status: AC
  Administered 2021-01-28: 10 mg via INTRAMUSCULAR
  Filled 2021-01-28: qty 1

## 2021-01-28 NOTE — ED Notes (Signed)
Patient verbalizes understanding of discharge instructions. Opportunity for questioning and answers were provided. Armband removed by staff, pt discharged from ED. Ambulated out to lobby  

## 2021-01-28 NOTE — ED Triage Notes (Signed)
Lower back pain on and off for a while, worse since yesterday. Left knee pain of and off, worse since yesterday. Denies specific injury. Ambulatory with steady gait.

## 2021-01-28 NOTE — ED Provider Notes (Signed)
[facility] EMERGENCY DEPARTMENT Provider Note   CSN: [id number] Arrival date & time: 01/28/21  0015     History  Chief Complaint  Patient presents with   Back Pain    Lisa Blevins is a 36 y.o. female.  Patient presents to the emergency department for evaluation of back pain and left knee pain.  Patient reports that both problems have been "on and off for a while".  Patient reports that she is not having any pain right now, but wants to "catch it before it comes".  Patient reports that when the back pain is present it is on both sides and radiates into her buttocks.  Does not radiate down the legs, no numbness, tingling, weakness of lower extremities.  No change in bowel or bladder function.  No trauma.  Patient also complains of the left knee hurting which has been ongoing issue for her.  She reports for the last year she has had transportation issues and has had to walk to work which makes the knee worse.  No direct trauma.  No swelling.  No redness.      Home Medications Prior to Admission medications   Medication Sig Start Date End Date Taking? Authorizing Provider  benzonatate (TESSALON) 100 MG capsule Take 1 capsule (100 mg total) by mouth every 8 (eight) hours. 01/07/21   Kommor, [place], MD  guaiFENesin (ROBITUSSIN) 100 MG/5ML liquid Take 5-10 mLs (100-200 mg total) by mouth every 4 (four) hours as needed for cough or congestion. 10/19/15   Fayrene Helper, PA-C  HYDROcodone-acetaminophen (NORCO/VICODIN) 5-325 MG tablet Take 1 tablet by mouth every 4 (four) hours as needed. 01/25/18   Jacalyn Lefevre, MD  ibuprofen (ADVIL) 600 MG tablet Take 1 tablet (600 mg total) by mouth every 6 (six) hours as needed. 05/10/20   [name], MD  methocarbamol (ROBAXIN) 500 MG tablet Take 1 tablet (500 mg total) by mouth 2 (two) times daily. 10/28/20   McDonald, Mia A, PA-C  methylPREDNISolone (MEDROL DOSEPAK) 4 MG TBPK tablet Take as directed 12/06/18   Dartha Lodge, PA-C   naproxen (NAPROSYN) 500 MG tablet Take 1 tablet (500 mg total) by mouth 2 (two) times daily. 12/06/18   Dartha Lodge, PA-C  Phenylephrine-APAP-Guaifenesin (TYLENOL COLD & HEAD) 5-325-200 MG TABS Take 2 tablets by mouth daily as needed (for cold/ sinus).    [provider]  terbinafine (LAMISIL) 250 MG tablet Take 250 mg by mouth daily. 09/15/16   [provider]      Allergies    Patient has no known allergies.    Review of Systems   Review of Systems  Musculoskeletal:  Positive for arthralgias and back pain.   Physical Exam Updated Vital Signs BP 130/84 (BP Location: Right Arm)    Pulse 65    Temp 97.9 F (36.6 C)    Resp 18    SpO2 97%  Physical Exam Vitals and nursing note reviewed.  Constitutional:      General: She is not in acute distress.    Appearance: Normal appearance. She is well-developed.  HENT:     Head: Normocephalic and atraumatic.     Right Ear: Hearing normal.     Left Ear: Hearing normal.     Nose: Nose normal.  Eyes:     Conjunctiva/sclera: Conjunctivae normal.     Pupils: Pupils are equal, round, and reactive to light.  Cardiovascular:     Rate and Rhythm: Regular rhythm.  Heart sounds: S1 normal and S2 normal. No murmur heard.   No friction rub. No gallop.  Pulmonary:     Effort: Pulmonary effort is normal. No respiratory distress.     Breath sounds: Normal breath sounds.  Chest:     Chest wall: No tenderness.  Abdominal:     General: Bowel sounds are normal.     Palpations: Abdomen is soft.     Tenderness: There is no abdominal tenderness. There is no guarding or rebound. Negative signs include Murphy's sign and McBurney's sign.     Hernia: No hernia is present.  Musculoskeletal:        General: Normal range of motion.     Cervical back: Normal, normal range of motion and neck supple.     Thoracic back: Normal.     Lumbar back: Normal. Negative right straight leg raise test and negative left straight leg raise test.   Skin:    General: Skin is warm and dry.     Findings: No rash.  Neurological:     Mental Status: She is alert and oriented to person, place, and time.     GCS: GCS eye subscore is 4. GCS verbal subscore is 5. GCS motor subscore is 6.     Cranial Nerves: No cranial nerve deficit.     Sensory: No sensory deficit.     Coordination: Coordination normal.     Deep Tendon Reflexes:     Reflex Scores:      Patellar reflexes are 1+ on the right side and 1+ on the left side. Psychiatric:        Speech: Speech normal.        Behavior: Behavior normal.        Thought Content: Thought content normal.    ED Results / Procedures / Treatments   Labs (all labs ordered are listed, but only abnormal results are displayed) Labs Reviewed - No data to display  EKG None  Radiology No results found.  Procedures Procedures    Medications Ordered in ED Medications  dexamethasone (DECADRON) injection 10 mg (has no administration in time range)    ED Course/ Medical Decision Making/ A&P                           Medical Decision Making Risk Prescription drug management.   Patient presents to the emergency department for evaluation of back pain and knee pain.  Both of these issues appear to be chronic in nature.  No red flags on examination.  Does not require any imaging.  Patient asking for something stronger than ibuprofen because it has not been helping.  I do not feel that she requires opiate analgesics at this time.  Examination of the knee is unremarkable.  No joint effusion, no erythema, no warmth.  Patient has preserved range of motion.        Final Clinical Impression(s) / ED Diagnoses Final diagnoses:  Chronic bilateral low back pain without sciatica  Chronic pain of left knee    Rx / DC Orders ED Discharge Orders     None         Gilda Crease, MD 01/28/21 0111

## 2021-02-14 ENCOUNTER — Emergency Department (HOSPITAL_COMMUNITY): Payer: Medicaid Other

## 2021-02-14 ENCOUNTER — Other Ambulatory Visit: Payer: Self-pay

## 2021-02-14 ENCOUNTER — Encounter (HOSPITAL_COMMUNITY): Payer: Self-pay

## 2021-02-14 ENCOUNTER — Emergency Department (HOSPITAL_COMMUNITY)
Admission: EM | Admit: 2021-02-14 | Discharge: 2021-02-14 | Disposition: A | Discharge status: Home / Self Care | Payer: Medicaid Other | Attending: Emergency Medicine | Admitting: Emergency Medicine

## 2021-02-14 DIAGNOSIS — J029 Acute pharyngitis, unspecified: Secondary | ICD-10-CM | POA: Insufficient documentation

## 2021-02-14 DIAGNOSIS — R109 Unspecified abdominal pain: Secondary | ICD-10-CM | POA: Insufficient documentation

## 2021-02-14 DIAGNOSIS — R197 Diarrhea, unspecified: Secondary | ICD-10-CM | POA: Diagnosis not present

## 2021-02-14 DIAGNOSIS — M549 Dorsalgia, unspecified: Secondary | ICD-10-CM | POA: Diagnosis not present

## 2021-02-14 DIAGNOSIS — M791 Myalgia, unspecified site: Secondary | ICD-10-CM

## 2021-02-14 DIAGNOSIS — M7918 Myalgia, other site: Secondary | ICD-10-CM | POA: Diagnosis not present

## 2021-02-14 DIAGNOSIS — G4489 Other headache syndrome: Secondary | ICD-10-CM | POA: Insufficient documentation

## 2021-02-14 DIAGNOSIS — Z20822 Contact with and (suspected) exposure to covid-19: Secondary | ICD-10-CM | POA: Diagnosis not present

## 2021-02-14 DIAGNOSIS — R519 Headache, unspecified: Secondary | ICD-10-CM | POA: Diagnosis present

## 2021-02-14 LAB — I-STAT CHEM 8, ED
BUN: 8 mg/dL (ref 6–20)
Calcium, Ion: 1.17 mmol/L (ref 1.15–1.40)
Chloride: 103 mmol/L (ref 98–111)
Creatinine, Ser: 0.7 mg/dL (ref 0.44–1.00)
Glucose, Bld: 90 mg/dL (ref 70–99)
HCT: 38 % (ref 36.0–46.0)
Hemoglobin: 12.9 g/dL (ref 12.0–15.0)
Potassium: 4.3 mmol/L (ref 3.5–5.1)
Sodium: 140 mmol/L (ref 135–145)
TCO2: 28 mmol/L (ref 22–32)

## 2021-02-14 LAB — RESP PANEL BY RT-PCR (FLU A&B, COVID) ARPGX2
Influenza A by PCR: NEGATIVE
Influenza B by PCR: NEGATIVE
SARS Coronavirus 2 by RT PCR: NEGATIVE

## 2021-02-14 LAB — URINALYSIS, ROUTINE W REFLEX MICROSCOPIC
Bilirubin Urine: NEGATIVE
Glucose, UA: NEGATIVE mg/dL
Hgb urine dipstick: NEGATIVE
Ketones, ur: NEGATIVE mg/dL
Leukocytes,Ua: NEGATIVE
Nitrite: NEGATIVE
Protein, ur: NEGATIVE mg/dL
Specific Gravity, Urine: 1.01 (ref 1.005–1.030)
pH: 7 (ref 5.0–8.0)

## 2021-02-14 LAB — POC URINE PREG, ED: Preg Test, Ur: NEGATIVE

## 2021-02-14 MED ORDER — ACETAMINOPHEN 325 MG PO TABS
650.0000 mg | ORAL_TABLET | Freq: Once | ORAL | Status: AC
  Administered 2021-02-14: 650 mg via ORAL
  Filled 2021-02-14: qty 2

## 2021-02-14 NOTE — ED Notes (Signed)
Reviewed discharge instructions with patient. Follow-up care reviewed. Patient verbalized understanding. Patient A&Ox4, VSS, and ambulatory with steady gait upon discharge.  

## 2021-02-14 NOTE — ED Notes (Signed)
Provided pt w/ urine cup to provide urine specimen

## 2021-02-14 NOTE — ED Triage Notes (Signed)
Patient complains of 1 day of body aches, headache, congestion with fever. Taking otc medicine with no relief

## 2021-02-14 NOTE — ED Provider Notes (Signed)
[facility] EMERGENCY DEPARTMENT Provider Note   CSN: [id number] Arrival date & time: 02/14/21  1146     History  Chief Complaint - bodyaches, headache   Lisa Blevins is a 36 y.o. female.  The history is provided by the patient.     Patient reports for the past 2 days she has felt like she had the flu.  She reports body, headache she has felt febrile.  No cough but she has had some sore throat.  She also mentions shortness of breath.  No chest pain reported.  She reports some mild abdominal pain and back pain that are improving No dysuria. She reports some vomiting and small amount of diarrhea over the past 24 hours. She reports she takes ibuprofen frequently for her symptoms.  She thinks she has the flu Home Medications Prior to Admission medications   Medication Sig Start Date End Date Taking? Authorizing Provider  ibuprofen (ADVIL) 200 MG tablet Take 200 mg by mouth every 6 (six) hours as needed for mild pain.   Yes [provider]  traMADol (ULTRAM) 50 MG tablet Take 50 mg by mouth every 6 (six) hours as needed for moderate pain.   Yes [provider]      Allergies    Patient has no known allergies.    Review of Systems   Review of Systems  Constitutional:  Positive for fatigue and fever.  HENT:  Positive for sore throat.   Respiratory:  Negative for cough.   Gastrointestinal:  Positive for abdominal pain, diarrhea and vomiting.  Musculoskeletal:  Positive for back pain.  Neurological:  Positive for headaches.  All other systems reviewed and are negative.  Physical Exam Updated Vital Signs BP 135/82 (BP Location: Right Arm)    Pulse 72    Temp 98.2 F (36.8 C) (Oral)    Resp 16    SpO2 100%  Physical Exam CONSTITUTIONAL: Well developed/well nourished HEAD: Normocephalic/atraumatic EYES: EOMI/PERRL, no nystagmus, no ptosis ENMT: Mucous membranes moist, uvula midline no erythema or exudate NECK: supple no meningeal  signs SPINE/BACK:entire spine nontender CV: S1/S2 noted, no murmurs/rubs/gallops noted LUNGS: Lungs are clear to auscultation bilaterally, no apparent distress ABDOMEN: soft, nontender, no rebound or guarding GU:no cva tenderness NEURO:Awake/alert, face symmetric, no arm or leg drift is noted Cranial nerves 3/4/5/6/07/10/08/11/12 tested and intact Sensation to light touch intact in all extremities Patient is ambulatory without difficulty EXTREMITIES: pulses normal, full ROM SKIN: warm, color normal PSYCH: no abnormalities of mood noted, alert and oriented to situation  ED Results / Procedures / Treatments   Labs (all labs ordered are listed, but only abnormal results are displayed) Labs Reviewed  URINALYSIS, ROUTINE W REFLEX MICROSCOPIC - Abnormal; Notable for the following components:      Result Value   APPearance HAZY (*)    All other components within normal limits  RESP PANEL BY RT-PCR (FLU A&B, COVID) ARPGX2  POC URINE PREG, ED  I-STAT CHEM 8, ED    EKG None  Radiology DG Chest 2 View  Result Date: 02/14/2021 CLINICAL DATA:  Shortness of breath EXAM: CHEST - 2 VIEW COMPARISON:  01/07/2021 FINDINGS: The heart size and mediastinal contours are within normal limits. Both lungs are clear. The visualized skeletal structures are unremarkable. IMPRESSION: No acute abnormality of the lungs. Electronically Signed   By: Jearld Lesch M.D.   On: 02/14/2021 13:16    Procedures Procedures    Medications Ordered in ED Medications  acetaminophen (TYLENOL) tablet  650 mg (650 mg Oral Given 02/14/21 1627)    ED Course/ Medical Decision Making/ A&P Clinical Course as of 02/14/21 1738  Mon Feb 14, 2021  1737 Patient feeling improved, resting comfortably.  All imaging and labs were negative.  Will discharge home.  Could be other viral illness causing her congestion and sore throat.  We discussed return precautions [DW]    Clinical Course User Index [DW] Zadie Rhine, MD                            Medical Decision Making Amount and/or Complexity of Data Reviewed Labs: ordered.  Risk OTC drugs.   This patient presents to the ED for concern of flulike symptoms, this involves an extensive number of treatment options, and is a complaint that carries with it a high risk of complications and morbidity.  The differential diagnosis includes influenza, COVID-19, pneumonia, urinary tract infection, meningitis.,  Sepsis, dehydration, renal failure    Social Determinants of Health: Patients unhoused and impaired access to primary care  increases the complexity of managing their presentation  Additional history obtained: Records reviewed Primary Care Documents  Lab Tests: I Ordered, and personally interpreted labs.  The pertinent results include: No acute findings  Imaging Studies ordered: I ordered imaging studies including X-ray chest I independently visualized and interpreted imaging which showed no acute findings I agree with the radiologist interpretation   Medicines ordered and prescription drug management: I ordered medication including Tylenol for body aches and headache Reevaluation of the patient after these medicines showed that the patient    improved    Reevaluation: After the interventions noted above, I reevaluated the patient and found that they have :improved  Complexity of problems addressed: Patients presentation is most consistent with  acute illness/injury with systemic symptoms      Disposition: After consideration of the diagnostic results and the patients response to treatment,  I feel that the patent would benefit from discharge .    Low suspicion for meningitis, SAH, or other acute neurologic emergency. She is safe for discharge home.        Final Clinical Impression(s) / ED Diagnoses Final diagnoses:  Other headache syndrome  Myalgia    Rx / DC Orders ED Discharge Orders     None         Zadie Rhine,  MD 02/14/21 1739

## 2021-02-14 NOTE — ED Provider Triage Note (Signed)
Emergency Medicine Provider Triage Evaluation Note  Lisa Blevins , a 36 y.o. female  was evaluated in triage.  Pt complains of flulike symptoms of 2-day duration.  Reports tactile fever.  Endorses shortness of breath and has been using her albuterol inhaler more frequently.  Endorses generalized body aches and headache.  Denies other complaints.    Review of Systems  Positive: As above Negative: As above  Physical Exam  BP 135/82 (BP Location: Right Arm)    Pulse 72    Temp 98.2 F (36.8 C) (Oral)    Resp 16    SpO2 100%  Gen:   Awake, no distress   Resp:  Normal effort  MSK:   Moves extremities without difficulty  Other:    Medical Decision Making  Medically screening exam initiated at 12:38 PM.  Appropriate orders placed.  Lisa Blevins was informed that the remainder of the evaluation will be completed by another provider, this initial triage assessment does not replace that evaluation, and the importance of remaining in the ED until their evaluation is complete.     Evlyn Courier, PA-C 02/14/21 1239

## 2021-02-14 NOTE — Discharge Instructions (Signed)

## 2021-05-11 ENCOUNTER — Other Ambulatory Visit: Payer: Self-pay | Admitting: Internal Medicine

## 2021-05-12 LAB — TIQ- AMBIGUOUS ORDER

## 2021-05-14 LAB — COMPLETE METABOLIC PANEL WITH GFR
AG Ratio: 1.4 (calc) (ref 1.0–2.5)
ALT: 15 U/L (ref 6–29)
AST: 28 U/L (ref 10–30)
Albumin: 4 g/dL (ref 3.6–5.1)
Alkaline phosphatase (APISO): 38 U/L (ref 31–125)
BUN/Creatinine Ratio: 14 (calc) (ref 6–22)
BUN: 14 mg/dL (ref 7–25)
CO2: 23 mmol/L (ref 20–32)
Calcium: 8.7 mg/dL (ref 8.6–10.2)
Chloride: 106 mmol/L (ref 98–110)
Creat: 1.02 mg/dL — ABNORMAL HIGH (ref 0.50–0.97)
Globulin: 2.8 g/dL (calc) (ref 1.9–3.7)
Glucose, Bld: 75 mg/dL (ref 65–99)
Potassium: 4.8 mmol/L (ref 3.5–5.3)
Sodium: 140 mmol/L (ref 135–146)
Total Bilirubin: 0.3 mg/dL (ref 0.2–1.2)
Total Protein: 6.8 g/dL (ref 6.1–8.1)
eGFR: 72 mL/min/{1.73_m2} (ref >=60)

## 2021-05-14 LAB — LIPID PANEL
Cholesterol: 158 mg/dL (ref <200)
HDL: 68 mg/dL (ref >=50)
LDL Cholesterol (Calc): 69 mg/dL (calc)
Non-HDL Cholesterol (Calc): 90 mg/dL (calc) (ref <130)
Total CHOL/HDL Ratio: 2.3 (calc) (ref <5.0)
Triglycerides: 125 mg/dL (ref <150)

## 2021-05-14 LAB — CBC
HCT: 36.1 % (ref 35.0–45.0)
Hemoglobin: 11.6 g/dL — ABNORMAL LOW (ref 11.7–15.5)
MCH: 27.8 pg (ref 27.0–33.0)
MCHC: 32.1 g/dL (ref 32.0–36.0)
MCV: 86.6 fL (ref 80.0–100.0)
MPV: 11.4 fL (ref 7.5–12.5)
Platelets: 227 10*3/uL (ref 140–400)
RBC: 4.17 10*6/uL (ref 3.80–5.10)
RDW: 12.8 % (ref 11.0–15.0)
WBC: 5.4 10*3/uL (ref 3.8–10.8)

## 2021-05-14 LAB — ANA: Anti Nuclear Antibody (ANA): NEGATIVE

## 2021-05-14 LAB — SEDIMENTATION RATE

## 2021-05-14 LAB — URIC ACID: Uric Acid, Serum: 5 mg/dL (ref 2.5–7.0)

## 2021-05-14 LAB — VITAMIN D 25 HYDROXY (VIT D DEFICIENCY, FRACTURES): Vit D, 25-Hydroxy: 28 ng/mL — ABNORMAL LOW (ref 30–100)

## 2021-05-14 LAB — RHEUMATOID FACTOR: Rheumatoid fact SerPl-aCnc: <14 IU/mL (ref <14)

## 2021-05-18 ENCOUNTER — Emergency Department (HOSPITAL_COMMUNITY): Payer: Medicaid Other

## 2021-05-18 ENCOUNTER — Other Ambulatory Visit: Payer: Self-pay

## 2021-05-18 ENCOUNTER — Emergency Department (HOSPITAL_COMMUNITY)
Admission: EM | Admit: 2021-05-18 | Discharge: 2021-05-18 | Disposition: A | Discharge status: Home / Self Care | Payer: Medicaid Other | Attending: Emergency Medicine | Admitting: Emergency Medicine

## 2021-05-18 DIAGNOSIS — M545 Low back pain, unspecified: Secondary | ICD-10-CM | POA: Insufficient documentation

## 2021-05-18 DIAGNOSIS — M546 Pain in thoracic spine: Secondary | ICD-10-CM | POA: Diagnosis not present

## 2021-05-18 DIAGNOSIS — M542 Cervicalgia: Secondary | ICD-10-CM | POA: Insufficient documentation

## 2021-05-18 LAB — I-STAT BETA HCG BLOOD, ED (MC, WL, AP ONLY): I-stat hCG, quantitative: <5 m[IU]/mL (ref <5)

## 2021-05-18 MED ORDER — IBUPROFEN 800 MG PO TABS
800.0000 mg | ORAL_TABLET | Freq: Once | ORAL | Status: AC
  Administered 2021-05-18: 800 mg via ORAL
  Filled 2021-05-18: qty 1

## 2021-05-18 MED ORDER — CYCLOBENZAPRINE HCL 10 MG PO TABS: 10.0000 mg | ORAL_TABLET | Freq: Two times a day (BID) | ORAL | 0 refills | Status: AC | PRN

## 2021-05-18 NOTE — Discharge Instructions (Signed)
Your x-rays today were negative for fracture.  You are likely suffering some muscular pain which is very common in the back muscles.  Continue to take ibuprofen or Tylenol.  I have also sent you in a prescription for a muscle relaxer which may help with some of your symptoms.  Please avoid complete bedrest and take 10 to 15-minute walks whenever you can throughout the day.  I would also look up stretches for lower back pain to include a child's pose or supine twists. ?

## 2021-05-18 NOTE — ED Provider Triage Note (Signed)
Emergency Medicine Provider Triage Evaluation Note  Lisa Blevins , a 36 y.o. female  was evaluated in triage.  Pt complains of lower to mid back pain after having sexual intercourse a few days ago.  Pain was noted after and not during.  Patient states that it "feels in her spine" and feels that she has a "fever within her spine".  She denies saddle paresthesia, numbness, tingling.  She also has similar pain in her neck that she reports has been there for approximately 1 year.  Review of Systems  Positive:  Negative:   Physical Exam  BP 137/90 (BP Location: Right Arm)   Pulse 82   Temp 98.4 F (36.9 C) (Oral)   Resp 18   Ht 5\' 11"  (1.803 m)   Wt 99.8 kg   SpO2 98%   BMI 30.68 kg/m  Gen:   Awake, no distress, sitting up in chair, reading a book Resp:  Normal effort  MSK:   Moves extremities without difficulty, neck supple, full range of motion, negative meningeal signs Other:  Possible, very mild midline tenderness of the upper lumbar spine.  Medical Decision Making  Medically screening exam initiated at 10:51 AM.  Appropriate orders placed.  ADAMARIS KING was informed that the remainder of the evaluation will be completed by another provider, this initial triage assessment does not replace that evaluation, and the importance of remaining in the ED until their evaluation is complete.     Liberty Handy, Janell Quiet 05/18/21 1054

## 2021-05-18 NOTE — ED Triage Notes (Signed)
Pt. Stated, Im having back pain in the neck area going down started about over a year ago. ?

## 2021-05-19 NOTE — ED Provider Notes (Signed)
[facility] EMERGENCY DEPARTMENT Provider Note   CSN: [id number] Arrival date & time: 05/18/21  1005     History  Chief Complaint  Patient presents with   Back Pain    Lisa Blevins is a 36 y.o. female who complains of lower to mid back pain after having sexual intercourse a few days ago.  Pain was noted after and not during.  Patient states that it "feels in her spine" and feels that she has a "fever within her spine".  She is ambulatory without difficulty.  No treatment prior to arrival.  She denies saddle paresthesia, numbness, tingling.  She also has similar pain in her neck that she reports has been there for approximately 1 year.   Back Pain     Home Medications Prior to Admission medications   Medication Sig Start Date End Date Taking? Authorizing Provider  cyclobenzaprine (FLEXERIL) 10 MG tablet Take 1 tablet (10 mg total) by mouth 2 (two) times daily as needed for muscle spasms. 05/18/21  Yes Raynald Blend R, PA-C  ibuprofen (ADVIL) 200 MG tablet Take 200 mg by mouth every 6 (six) hours as needed for mild pain.    [provider]  traMADol (ULTRAM) 50 MG tablet Take 50 mg by mouth every 6 (six) hours as needed for moderate pain.    [provider]      Allergies    Patient has no known allergies.    Review of Systems   Review of Systems  Musculoskeletal:  Positive for back pain.   Physical Exam Updated Vital Signs BP 137/90 (BP Location: Right Arm)   Pulse 82   Temp 98.4 F (36.9 C) (Oral)   Resp 18   Ht 5\' 11"  (1.803 m)   Wt 99.8 kg   SpO2 98%   BMI 30.68 kg/m  Physical Exam Vitals and nursing note reviewed.  Constitutional:      General: She is not in acute distress.    Appearance: She is not ill-appearing.     Comments: Sitting up in chair, reading a book in no acute distress  HENT:     Head: Atraumatic.  Eyes:     Conjunctiva/sclera: Conjunctivae normal.  Neck:     Comments: Neck with full ROM, supple,  negative meningeal signs.  Negative midline tenderness, no paraspinal tenderness Cardiovascular:     Rate and Rhythm: Normal rate and regular rhythm.     Pulses: Normal pulses.     Heart sounds: No murmur heard. Pulmonary:     Effort: Pulmonary effort is normal. No respiratory distress.     Breath sounds: Normal breath sounds.  Abdominal:     General: Abdomen is flat. There is no distension.     Palpations: Abdomen is soft.     Tenderness: There is no abdominal tenderness.  Musculoskeletal:        General: Normal range of motion.     Cervical back: Normal range of motion.     Comments: Mild tenderness to palpation at midline of upper lumbar, mild paraspinal tenderness.  Patient is able to stand up and walk without any gait disturbances.  Skin:    General: Skin is warm and dry.     Capillary Refill: Capillary refill takes less than 2 seconds.  Neurological:     General: No focal deficit present.     Mental Status: She is alert.  Psychiatric:        Mood and Affect: Mood normal.  ED Results / Procedures / Treatments   Labs (all labs ordered are listed, but only abnormal results are displayed) Labs Reviewed  I-STAT BETA HCG BLOOD, ED ([facility], [facility], [facility] ONLY)    EKG None  Radiology DG Thoracic Spine 2 View  Result Date: 05/18/2021 CLINICAL DATA:  Chronic back pain. EXAM: THORACIC SPINE 2 VIEWS COMPARISON:  Chest x-ray dated February 14, 2021. FINDINGS: Twelve rib-bearing thoracic vertebral bodies. No acute fracture or subluxation. Vertebral body heights are preserved. Alignment is normal. Intervertebral disc spaces are maintained. IMPRESSION: Negative. Electronically Signed   By: Obie Dredge M.D.   On: 05/18/2021 11:59   DG Lumbar Spine Complete  Result Date: 05/18/2021 CLINICAL DATA:  Upper back pain radiating to the lower back for 1 year. Increasing pain over the last several days. No previous relevant surgery. EXAM: LUMBAR SPINE - COMPLETE 4+ VIEW COMPARISON:  CT lumbar spine  10/28/2020.  Radiographs 10/27/2020. FINDINGS: Transitional lumbosacral anatomy. In correlation with thoracic spine radiographs done today, there are 12 rib-bearing thoracic type vertebral bodies and a transitional, largely lumbarized S1 segment. The alignment is normal. The disc spaces remain largely preserved. There is no evidence of acute fracture or pars defect. Mild facet degenerative changes inferiorly. Intrauterine device noted. IMPRESSION: Stable lumbar spine radiographs, without acute findings or malalignment. Transitional lumbosacral anatomy. Electronically Signed   By: Carey Bullocks M.D.   On: 05/18/2021 12:01    Procedures Procedures  36 year old female in no acute distress, nontoxic-appearing presents to the ED for evaluation of back and neck pain Medications Ordered in ED Medications  ibuprofen (ADVIL) tablet 800 mg (800 mg Oral Given 05/18/21 1316)    ED Course/ Medical Decision Making/ A&P                           Medical Decision Making Amount and/or Complexity of Data Reviewed Radiology: ordered.  Risk Prescription drug management.   36 year old female presents to the ED for evaluation of back and neck pain that started after sexual intercourse a few days ago.  Differentials include lumbar strain, fracture, meningitis, PID.  Patient is nontoxic, and sitting up comfortably in chair reading a book.  Vitals without significant abnormality.  Negative midline or paraspinal cervical tenderness.  She does have mild midline upper lumbar tenderness.  Full range of motion of the trunk and neck without meningeal signs.  Abdomen is soft, nondistended nontender.  She denies vaginal discharge or discomfort.  Given these findings, low concern for meningitis or PID.  X-rays of the lumbar and T-spine without evidence of fracture, or dislocation.  Patient likely suffering from muscle strain.  Advised ibuprofen and Tylenol and prescribed muscle relaxers to use as needed.  Discussed at home  stretching for pain.  Patient expresses understanding is amenable to plan.  Discharged home in good condition.  Final diagnoses:  Acute midline low back pain without sciatica    Rx / DC Orders ED Discharge Orders          Ordered    cyclobenzaprine (FLEXERIL) 10 MG tablet  2 times daily PRN        05/18/21 1325              Janell Quiet, PA-C 05/19/21 0946    Gwyneth Sprout, MD 05/19/21 [phone]

## 2021-07-08 ENCOUNTER — Encounter: Payer: Self-pay | Admitting: Radiology

## 2021-10-14 ENCOUNTER — Emergency Department (HOSPITAL_COMMUNITY): Payer: Medicaid Other

## 2021-10-14 ENCOUNTER — Encounter (HOSPITAL_COMMUNITY): Payer: Self-pay

## 2021-10-14 ENCOUNTER — Emergency Department (HOSPITAL_COMMUNITY)
Admission: EM | Admit: 2021-10-14 | Discharge: 2021-10-15 | Discharge status: Against Medical Advice | Payer: Medicaid Other | Attending: Emergency Medicine | Admitting: Emergency Medicine

## 2021-10-14 ENCOUNTER — Other Ambulatory Visit: Payer: Self-pay

## 2021-10-14 DIAGNOSIS — M25562 Pain in left knee: Secondary | ICD-10-CM

## 2021-10-14 DIAGNOSIS — M25561 Pain in right knee: Secondary | ICD-10-CM

## 2021-10-14 DIAGNOSIS — M545 Low back pain, unspecified: Secondary | ICD-10-CM

## 2021-10-14 MED ORDER — ACETAMINOPHEN 325 MG PO TABS
650.0000 mg | ORAL_TABLET | Freq: Once | ORAL | Status: AC
  Administered 2021-10-14: 650 mg via ORAL
  Filled 2021-10-14: qty 2

## 2021-10-14 NOTE — ED Notes (Signed)
PT's Name called 3 times through lobby with no response

## 2021-10-14 NOTE — ED Notes (Signed)
RN gave one last call for pt, no response.

## 2021-10-14 NOTE — ED Triage Notes (Signed)
Pt reports lower back pain and bilateral knee pain. She states this has been ongoing for 2-3 years but yesterday the pain worsened. She states she has been working harder at [employer] job. She reports she has been told she has fluid in her knees but nothing has been done. Pain unrelieved with ibuprofen.

## 2021-10-14 NOTE — ED Provider Notes (Signed)
  [facility] EMERGENCY DEPARTMENT Provider Note   CSN: [id number] Arrival date & time: 10/14/21  1249     History  Chief Complaint  Patient presents with   Back Pain    Lisa Blevins is a 36 y.o. female.  Patient name was placed in a room within the confines the patient.  So patient was never seen by me.  Patient also had a merged chart.  I have completed another chart stating that I did not see her.       Home Medications Prior to Admission medications   Not on File      Allergies    Patient has no known allergies.    Review of Systems   Review of Systems  Physical Exam Updated Vital Signs BP 106/68 (BP Location: Right Arm)   Pulse 73   Temp (!) 97.5 F (36.4 C) (Oral)   Resp 15   Ht 1.803 m (5\' 11" )   Wt 99.8 kg   LMP  (LMP Unknown)   SpO2 95%   BMI 30.68 kg/m  Physical Exam  ED Results / Procedures / Treatments   Labs (all labs ordered are listed, but only abnormal results are displayed) Labs Reviewed - No data to display  EKG None  Radiology DG Knee Complete 4 Views Left  Result Date: 10/14/2021 CLINICAL DATA:  Bilateral chronic knee pain EXAM: LEFT KNEE - COMPLETE 4 VIEW; RIGHT KNEE - COMPLETE 4+ VIEW COMPARISON:  05/10/2020 FINDINGS: No acute fracture or dislocation.Mild degenerative changes with relatively preserved joint spaces.Alignment is unremarkable.No significant soft tissue abnormality or foreign body. IMPRESSION: No acute osseous abnormality. Electronically Signed   By: Merilyn Baba M.D.   On: 10/14/2021 16:58   DG Knee Complete 4 Views Right  Result Date: 10/14/2021 CLINICAL DATA:  Bilateral chronic knee pain EXAM: LEFT KNEE - COMPLETE 4 VIEW; RIGHT KNEE - COMPLETE 4+ VIEW COMPARISON:  05/10/2020 FINDINGS: No acute fracture or dislocation.Mild degenerative changes with relatively preserved joint spaces.Alignment is unremarkable.No significant soft tissue abnormality or foreign body. IMPRESSION: No acute osseous  abnormality. Electronically Signed   By: Merilyn Baba M.D.   On: 10/14/2021 16:58    Procedures Procedures    Medications Ordered in ED Medications  acetaminophen (TYLENOL) tablet 650 mg (650 mg Oral Given 10/14/21 1624)    ED Course/ Medical Decision Making/ A&P                           Medical Decision Making  Patient called back from the waiting room could not be found.  Patient never seen by me. Final Clinical Impression(s) / ED Diagnoses Final diagnoses:  Pain in both knees, unspecified chronicity  Low back pain, unspecified back pain laterality, unspecified chronicity, unspecified whether sciatica present    Rx / DC Orders ED Discharge Orders     None         Fredia Sorrow, MD 10/14/21 2320

## 2021-10-14 NOTE — ED Provider Triage Note (Signed)
Emergency Medicine Provider Triage Evaluation Note  Lisa Blevins , a 36 y.o. female  was evaluated in triage.  Pt complains of bilateral knee pain going on for last couple days no traumatic injury states she has swelling on her knees, no paresthesias or weakness, she also has is having right-sided back pain no saddle paresthesias no urinary or bowel incontinence he is no recent trauma..  Review of Systems  Positive: Knee pain, back pain Negative: Chest pain, shortness of breath  Physical Exam  BP 135/78 (BP Location: Right Arm)   Pulse 71   Temp (!) 97.5 F (36.4 C) (Oral)   Resp 16   Ht 5\' 11"  (1.803 m)   Wt 99.8 kg   LMP  (LMP Unknown)   SpO2 100%   BMI 30.68 kg/m  Gen:   Awake, no distress   Resp:  Normal effort  MSK:   Moves extremities without difficulty  Other:    Medical Decision Making  Medically screening exam initiated at 4:16 PM.  Appropriate orders placed.  Lisa Blevins was informed that the remainder of the evaluation will be completed by another provider, this initial triage assessment does not replace that evaluation, and the importance of remaining in the ED until their evaluation is complete.  Imaging has been ordered   Marcello Fennel, PA-C 10/14/21 1616

## 2021-10-14 NOTE — ED Provider Notes (Signed)
  [facility] EMERGENCY DEPARTMENT Provider Note   CSN: [id number] Arrival date & time: 05/10/20  1741     History  Chief Complaint  Patient presents with   Hand Pain    Lisa Blevins is a 36 y.o. female.  Patient placed in room 2 in green pod but then they could not find the actual patient to put in the area and already signed up for the patient but never saw the patient.       Home Medications Prior to Admission medications   Medication Sig Start Date End Date Taking? Authorizing Provider  cyclobenzaprine (FLEXERIL) 10 MG tablet Take 1 tablet (10 mg total) by mouth 2 (two) times daily as needed for muscle spasms. 05/18/21   Tonye Pearson, PA-C  ibuprofen (ADVIL) 200 MG tablet Take 200 mg by mouth every 6 (six) hours as needed for mild pain.    [provider]  traMADol (ULTRAM) 50 MG tablet Take 50 mg by mouth every 6 (six) hours as needed for moderate pain.    [provider]      Allergies    Patient has no known allergies.    Review of Systems   Review of Systems  Physical Exam Updated Vital Signs BP 138/78 (BP Location: Right Arm)   Pulse 80   Temp 98.3 F (36.8 C) (Oral)   Resp 18   SpO2 98%  Physical Exam  ED Results / Procedures / Treatments   Labs (all labs ordered are listed, but only abnormal results are displayed) Labs Reviewed - No data to display  EKG None  Radiology No results found.  Procedures Procedures    Medications Ordered in ED Medications  oxyCODONE-acetaminophen (PERCOCET/ROXICET) 5-325 MG per tablet 1 tablet (1 tablet Oral Given 05/10/20 2019)    ED Course/ Medical Decision Making/ A&P                           Medical Decision Making Amount and/or Complexity of Data Reviewed Radiology: ordered.  Risk Prescription drug management.    Final Clinical Impression(s) / ED Diagnoses Final diagnoses:  Back pain, unspecified back location, unspecified back pain laterality,  unspecified chronicity    Rx / DC Orders ED Discharge Orders          Ordered    ibuprofen (ADVIL) 600 MG tablet  Every 6 hours PRN,   Status:  Discontinued        05/10/20 2105              Fredia Sorrow, MD 10/14/21 2015

## 2021-10-17 ENCOUNTER — Encounter (HOSPITAL_COMMUNITY): Payer: Self-pay

## 2022-04-20 ENCOUNTER — Other Ambulatory Visit: Payer: Self-pay

## 2022-04-20 ENCOUNTER — Emergency Department (HOSPITAL_COMMUNITY): Payer: Medicaid Other

## 2022-04-20 ENCOUNTER — Emergency Department (HOSPITAL_COMMUNITY)
Admission: EM | Admit: 2022-04-20 | Discharge: 2022-04-20 | Disposition: A | Discharge status: Home / Self Care | Payer: Medicaid Other | Attending: Emergency Medicine | Admitting: Emergency Medicine

## 2022-04-20 ENCOUNTER — Encounter (HOSPITAL_COMMUNITY): Payer: Self-pay | Admitting: Emergency Medicine

## 2022-04-20 DIAGNOSIS — M25562 Pain in left knee: Secondary | ICD-10-CM | POA: Diagnosis not present

## 2022-04-20 MED ORDER — IBUPROFEN 600 MG PO TABS: 600.0000 mg | ORAL_TABLET | Freq: Four times a day (QID) | ORAL | 0 refills | Status: AC | PRN

## 2022-04-20 MED ORDER — IBUPROFEN 400 MG PO TABS: 600.0000 mg | ORAL_TABLET | Freq: Once | ORAL | Status: DC

## 2022-04-20 NOTE — Discharge Instructions (Addendum)
Evaluation for your left knee pain is overall reassuring.  X-ray was negative.  Could be soft tissue injury of some kind related to overuse.  Recommend he follow-up with your PCP in 3 to 4 days.  Also recommend conservative treatment at home which includes rest, ice, compression and elevation of that knee.  You can ice the knee 3-4 times a day for 15 minutes for the next 48 hours.  You can also take Tylenol and ibuprofen for your pain.

## 2022-04-20 NOTE — ED Triage Notes (Signed)
Pt c/o bilateral knee pain, worse on left, that has been ongoing for years but worsening in the past couple days. Denies recent injuries/trauma. Denies chest pain or shob.

## 2022-04-20 NOTE — ED Provider Notes (Signed)
[place] EMERGENCY DEPARTMENT AT [facility] Provider Note   CSN: [id number] Arrival date & time: 04/20/22  1526     History  Chief Complaint  Patient presents with   Knee Pain   HPI SILVERIA BOTZ is a 37 y.o. female presenting for left knee pain. States has been going on for several years but has gotten worse in the last week.  The pain is primarily in the left knee but she is can have pain in the right knee as well. Pain is located on the anterior aspect of the knee and at times radiates to the back of the knee.  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  Denies any recent trauma.  States that she thinks there is fluid around the knee and that it swollen.  Denies fever or chills.  Denies redness or warmth around the knee.  States she did want to see her PCP about this complaint but that she has not been able to schedule an appointment and it would take several months to be seen.  Denies chest pain shortness of breath.     Knee Pain      Home Medications Prior to Admission medications   Medication Sig Start Date End Date Taking? Authorizing Provider  cyclobenzaprine (FLEXERIL) 10 MG tablet Take 1 tablet (10 mg total) by mouth 2 (two) times daily as needed for muscle spasms. 05/18/21   Janell Quiet, PA-C  ibuprofen (ADVIL) 200 MG tablet Take 200 mg by mouth every 6 (six) hours as needed for mild pain.    [provider]  traMADol (ULTRAM) 50 MG tablet Take 50 mg by mouth every 6 (six) hours as needed for moderate pain.    [provider]      Allergies    Patient has no known allergies.    Review of Systems   See HPI for pertinent positives  Physical Exam Updated Vital Signs BP (!) 143/87 (BP Location: Right Arm)   Pulse 81   Temp 98.7 F (37.1 C) (Oral)   Resp 14   Ht  (1.803 m)   Wt 95.3 kg   SpO2 100%   BMI 29.29 kg/m  Physical Exam Constitutional:      Appearance: Normal appearance.  HENT:      Head: Normocephalic.     Nose: Nose normal.  Eyes:     Conjunctiva/sclera: Conjunctivae normal.  Pulmonary:     Effort: Pulmonary effort is normal.  Musculoskeletal:     Left knee: No swelling, deformity or effusion. Normal range of motion. Tenderness present. Normal pulse.     Comments: Generalized tenderness about the left knee.  Gait is steady and able to bear weight on the left knee.  Did endorse pain with walking.  Neurological:     Mental Status: She is alert.  Psychiatric:        Mood and Affect: Mood normal.     ED Results / Procedures / Treatments   Labs (all labs ordered are listed, but only abnormal results are displayed) Labs Reviewed - No data to display  EKG None  Radiology DG Knee Complete 4 Views Left  Result Date: 04/20/2022 CLINICAL DATA:  Pain EXAM: LEFT KNEE - COMPLETE 4+ VIEW COMPARISON:  None Available. FINDINGS: No evidence of fracture, dislocation, or joint effusion. No evidence of arthropathy or other focal bone abnormality. Soft tissues are unremarkable. IMPRESSION: No fracture or dislocation Electronically Signed  By: Lorenza Cambridge M.D.   On: 04/20/2022 16:41    Procedures Procedures    Medications Ordered in ED Medications  ibuprofen (ADVIL) tablet 600 mg (has no administration in time range)    ED Course/ Medical Decision Making/ A&P                             Medical Decision Making Amount and/or Complexity of Data Reviewed Radiology: ordered.   37 year old female is well-appearing presenting for left knee pain.  Exam was reassuring did reveal some generalized tenderness about the knee joint.  DDx includes septic arthritis, osteoarthritis, fracture, dislocation, ligament injury.  Overall, left knee appears grossly normal.  X-ray was negative.  Have low suspicion for ligamentous injury given no trauma, and patient denies any instability in the knee.  Also low suspicion for infection in the knee given there is no observable swelling,  erythema or palpable effusion.Treated pain with ibuprofen.  Applied knee brace.  Advised conservative treatment at home.  Advised to follow-up with her PCP.          Final Clinical Impression(s) / ED Diagnoses Final diagnoses:  Left knee pain, unspecified chronicity    Rx / DC Orders ED Discharge Orders     None         Gareth Eagle, PA-C 04/20/22 1924    Elayne Snare K, DO 04/20/22 2354

## 2022-04-20 NOTE — ED Provider Triage Note (Signed)
Emergency Medicine Provider Triage Evaluation Note  Lisa Blevins , a 37 y.o. female  was evaluated in triage.  Pt complains of knee pain.  States has been going on for several years but has gotten worse in the last week.  The pain is primarily in the left knee but she is can have pain in the right knee as well.  Pain is located on the anterior aspect of the knee and at times radiates to the back of the knee.  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  Denies any recent trauma.  States that she thinks there is fluid around the knee and that it swollen.  Denies fever or chills.  Denies redness or warmth around the knee.  States she did want to see her PCP about this complaint but that she has not been able to schedule an appointment and it would take several months to be seen.  Denies chest pain shortness of breath.  Review of Systems  Positive: See above Negative: See above  Physical Exam  BP (!) 143/87 (BP Location: Right Arm)   Pulse 81   Temp 98.7 F (37.1 C) (Oral)   Resp 14   SpO2 100%  Gen:   Awake, no distress   Resp:  Normal effort  MSK:   Moves extremities without difficulty  Other:    Medical Decision Making  Medically screening exam initiated at 3:49 PM.  Appropriate orders placed.  Lisa Blevins was informed that the remainder of the evaluation will be completed by another provider, this initial triage assessment does not replace that evaluation, and the importance of remaining in the ED until their evaluation is complete.  Work up started   Gareth Eagle, PA-C 04/20/22 1550

## 2022-11-08 ENCOUNTER — Emergency Department (HOSPITAL_COMMUNITY)
Admission: EM | Admit: 2022-11-08 | Discharge: 2022-11-08 | Disposition: A | Discharge status: Home / Self Care | Payer: Medicaid Other | Attending: Emergency Medicine | Admitting: Emergency Medicine

## 2022-11-08 ENCOUNTER — Encounter (HOSPITAL_COMMUNITY): Payer: Self-pay | Admitting: Pharmacy Technician

## 2022-11-08 ENCOUNTER — Other Ambulatory Visit: Payer: Self-pay

## 2022-11-08 DIAGNOSIS — Z760 Encounter for issue of repeat prescription: Secondary | ICD-10-CM | POA: Diagnosis present

## 2022-11-08 MED ORDER — ALBUTEROL SULFATE HFA 108 (90 BASE) MCG/ACT IN AERS
1.0000 | INHALATION_SPRAY | Freq: Once | RESPIRATORY_TRACT | Status: AC
  Administered 2022-11-08: 1 via RESPIRATORY_TRACT
  Filled 2022-11-08: qty 6.7

## 2022-11-08 MED ORDER — ALBUTEROL SULFATE HFA 108 (90 BASE) MCG/ACT IN AERS: 1.0000 | INHALATION_SPRAY | Freq: Four times a day (QID) | RESPIRATORY_TRACT | 0 refills | Status: DC | PRN

## 2022-11-08 NOTE — ED Triage Notes (Signed)
Pt here for albuterol and sinus medication refills. States had an asthma attack yesterday and realized she was out. Pt in NAD, talking in complete sentences.

## 2022-11-08 NOTE — ED Provider Notes (Cosign Needed Addendum)
[place] EMERGENCY DEPARTMENT AT [facility] Provider Note   CSN: [id number] Arrival date & time: 11/08/22  1107     History  Chief Complaint  Patient presents with   Medication Refill    Lisa Blevins is a 37 y.o. female with a history of homelessness who presents to the ED today for a medication refill.  Patient reports that she has intermittent asthma and states that she had an asthma attack last night.  She believes that the weather brought it on.  She states she has not had one in a while but realized she was out of her albuterol inhaler.  Denies fevers, chills, shortness of breath, or chest pain.  No other concerns or complaints at this time.    Home Medications Prior to Admission medications   Medication Sig Start Date End Date Taking? Authorizing Provider  albuterol (VENTOLIN HFA) 108 (90 Base) MCG/ACT inhaler Inhale 1-2 puffs into the lungs every 6 (six) hours as needed for wheezing or shortness of breath. 11/08/22 12/08/22 Yes Maxwell Marion, PA-C  cyclobenzaprine (FLEXERIL) 10 MG tablet Take 1 tablet (10 mg total) by mouth 2 (two) times daily as needed for muscle spasms. 05/18/21   Janell Quiet, PA-C  ibuprofen (ADVIL) 600 MG tablet Take 1 tablet (600 mg total) by mouth every 6 (six) hours as needed. 04/20/22   Gareth Eagle, PA-C  traMADol (ULTRAM) 50 MG tablet Take 50 mg by mouth every 6 (six) hours as needed for moderate pain.    [provider]      Allergies    Patient has no known allergies.    Review of Systems   Review of Systems  Constitutional:        Medication refill  All other systems reviewed and are negative.   Physical Exam Updated Vital Signs BP 138/83   Pulse 64   Temp 98.3 F (36.8 C)   Resp 16   SpO2 100%  Physical Exam Vitals and nursing note reviewed.  Constitutional:      General: She is not in acute distress.    Appearance: Normal appearance.  HENT:     Head: Normocephalic and atraumatic.      Mouth/Throat:     Mouth: Mucous membranes are moist.  Eyes:     Conjunctiva/sclera: Conjunctivae normal.     Pupils: Pupils are equal, round, and reactive to light.  Cardiovascular:     Rate and Rhythm: Normal rate and regular rhythm.     Pulses: Normal pulses.     Heart sounds: Normal heart sounds.  Pulmonary:     Effort: Pulmonary effort is normal.     Breath sounds: Normal breath sounds.  Skin:    General: Skin is warm and dry.     Findings: No rash.  Neurological:     General: No focal deficit present.     Mental Status: She is alert.  Psychiatric:        Mood and Affect: Mood normal.        Behavior: Behavior normal.    ED Results / Procedures / Treatments   Labs (all labs ordered are listed, but only abnormal results are displayed) Labs Reviewed - No data to display  EKG None  Radiology No results found.  Procedures Procedures: not indicated.   Medications Ordered in ED Medications - No data to display  ED Course/ Medical Decision Making/ A&P  Medical Decision Making Risk Prescription drug management.   This patient presents to the ED for concern of medication refill, this involves an extensive number of treatment options, and is a complaint that carries with it a high risk of complications and morbidity.   Differential diagnosis includes: Medication refill, etc.   Comorbidities  See HPI above   Additional History  Additional history obtained from prior records.   Lab Tests  Not indicated at this time.   Imaging Studies  Not indicated at this time.   Problem List / ED Course / Critical Interventions / Medication Management  Medication refill I have reviewed the patients home medicines and have made adjustments as needed   Social Determinants of Health  Access to healthcare   Test / Admission - Considered  Patient is hemodynamically stable and safe for discharge home.  All questions  answered. Return precautions provided. Upon discharge patient stating that she would like an inhaler now.  I ordered one for her to get her to discharge and I also sent one to her pharmacy, per her initial request.       Final Clinical Impression(s) / ED Diagnoses Final diagnoses:  Medication refill    Rx / DC Orders ED Discharge Orders          Ordered    albuterol (VENTOLIN HFA) 108 (90 Base) MCG/ACT inhaler  Every 6 hours PRN        11/08/22 1219              Maxwell Marion, PA-C 11/08/22 1228    Maxwell Marion, PA-C 11/08/22 1245    Pricilla Loveless, MD 11/09/22 [phone]

## 2022-11-08 NOTE — ED Notes (Signed)
PT refused to have her vitals checked.  Pt was about her discharge paperwork.

## 2022-11-08 NOTE — Discharge Instructions (Addendum)
Use albuterol inhaler every 6 puffs as needed for wheezing or shortness of breath.  Follow-up with your PCP in 1 week for reevaluation of your symptoms. If you do not have one, there's a phone number in the discharge paperwork you can call that will help you get established.  Return to the ED if your asthma worsens in the interim.

## 2023-01-22 ENCOUNTER — Encounter (HOSPITAL_COMMUNITY): Payer: Self-pay

## 2023-01-22 ENCOUNTER — Other Ambulatory Visit: Payer: Self-pay

## 2023-01-22 ENCOUNTER — Emergency Department (HOSPITAL_COMMUNITY)
Admission: EM | Admit: 2023-01-22 | Discharge: 2023-01-22 | Disposition: A | Discharge status: Home / Self Care | Payer: Medicaid Other | Attending: Emergency Medicine | Admitting: Emergency Medicine

## 2023-01-22 ENCOUNTER — Emergency Department (HOSPITAL_COMMUNITY): Payer: Medicaid Other

## 2023-01-22 DIAGNOSIS — R059 Cough, unspecified: Secondary | ICD-10-CM | POA: Diagnosis present

## 2023-01-22 DIAGNOSIS — J069 Acute upper respiratory infection, unspecified: Secondary | ICD-10-CM | POA: Insufficient documentation

## 2023-01-22 DIAGNOSIS — J45909 Unspecified asthma, uncomplicated: Secondary | ICD-10-CM | POA: Insufficient documentation

## 2023-01-22 DIAGNOSIS — Z20822 Contact with and (suspected) exposure to covid-19: Secondary | ICD-10-CM | POA: Insufficient documentation

## 2023-01-22 LAB — CBC
HCT: 38.8 % (ref 36.0–46.0)
Hemoglobin: 12.5 g/dL (ref 12.0–15.0)
MCH: 27.9 pg (ref 26.0–34.0)
MCHC: 32.2 g/dL (ref 30.0–36.0)
MCV: 86.6 fL (ref 80.0–100.0)
Platelets: 283 10*3/uL (ref 150–400)
RBC: 4.48 MIL/uL (ref 3.87–5.11)
RDW: 13.5 % (ref 11.5–15.5)
WBC: 4.9 10*3/uL (ref 4.0–10.5)
nRBC: 0 % (ref 0.0–0.2)

## 2023-01-22 LAB — HCG, SERUM, QUALITATIVE: Preg, Serum: NEGATIVE

## 2023-01-22 LAB — BASIC METABOLIC PANEL
Anion gap: 8 (ref 5–15)
BUN: 13 mg/dL (ref 6–20)
CO2: 22 mmol/L (ref 22–32)
Calcium: 8.7 mg/dL — ABNORMAL LOW (ref 8.9–10.3)
Chloride: 107 mmol/L (ref 98–111)
Creatinine, Ser: 0.88 mg/dL (ref 0.44–1.00)
GFR, Estimated: >60 mL/min (ref >60)
Glucose, Bld: 92 mg/dL (ref 70–99)
Potassium: 4.3 mmol/L (ref 3.5–5.1)
Sodium: 137 mmol/L (ref 135–145)

## 2023-01-22 LAB — RESP PANEL BY RT-PCR (RSV, FLU A&B, COVID)  RVPGX2
Influenza A by PCR: NEGATIVE
Influenza B by PCR: NEGATIVE
Resp Syncytial Virus by PCR: NEGATIVE
SARS Coronavirus 2 by RT PCR: NEGATIVE

## 2023-01-22 LAB — TROPONIN I (HIGH SENSITIVITY): Troponin I (High Sensitivity): 6 ng/L (ref <18)

## 2023-01-22 MED ORDER — ALBUTEROL SULFATE HFA 108 (90 BASE) MCG/ACT IN AERS
2.0000 | INHALATION_SPRAY | Freq: Once | RESPIRATORY_TRACT | Status: AC
  Administered 2023-01-22: 2 via RESPIRATORY_TRACT
  Filled 2023-01-22: qty 6.7

## 2023-01-22 MED ORDER — ALBUTEROL SULFATE HFA 108 (90 BASE) MCG/ACT IN AERS: 2.0000 | INHALATION_SPRAY | Freq: Four times a day (QID) | RESPIRATORY_TRACT | 2 refills | Status: AC | PRN

## 2023-01-22 NOTE — ED Provider Notes (Signed)
[place] EMERGENCY DEPARTMENT AT [facility] Provider Note   CSN: [id number] Arrival date & time: 01/22/23  1013     History  Chief Complaint  Patient presents with   Chest Pain    Lisa Blevins is a 38 y.o. female.  Patient complains of a cough and congestion.  Patient is worried that she could have COVID or the flu.  Patient reports missing work and needs a note in order to be able to return stating that she is not sick.  Patient reports she has past medical history of asthma.  Patient states that she is out of her albuterol inhaler and she is requesting an albuterol inhaler here.  Patient reports that she has had a fever but no fever today.  Patient currently denies any shortness of breath.   Chest Pain      Home Medications Prior to Admission medications   Medication Sig Start Date End Date Taking? Authorizing Provider  albuterol (VENTOLIN HFA) 108 (90 Base) MCG/ACT inhaler Inhale 1-2 puffs into the lungs every 6 (six) hours as needed for wheezing or shortness of breath. 11/08/22 12/08/22  Maxwell Marion, PA-C  cyclobenzaprine (FLEXERIL) 10 MG tablet Take 1 tablet (10 mg total) by mouth 2 (two) times daily as needed for muscle spasms. 05/18/21   Janell Quiet, PA-C  ibuprofen (ADVIL) 600 MG tablet Take 1 tablet (600 mg total) by mouth every 6 (six) hours as needed. 04/20/22   Gareth Eagle, PA-C  traMADol (ULTRAM) 50 MG tablet Take 50 mg by mouth every 6 (six) hours as needed for moderate pain.    [provider]      Allergies    Patient has no known allergies.    Review of Systems   Review of Systems  Cardiovascular:  Positive for chest pain.  All other systems reviewed and are negative.   Physical Exam Updated Vital Signs BP (!) 144/99   Pulse 60   Temp 97.6 F (36.4 C) (Oral)   Resp 20   Ht 5\' 11"  (1.803 m)   Wt 99.8 kg   SpO2 100%   BMI 30.68 kg/m  Physical Exam Vitals and nursing note reviewed.  Constitutional:       Appearance: She is well-developed.  HENT:     Head: Normocephalic.  Cardiovascular:     Rate and Rhythm: Normal rate and regular rhythm.     Heart sounds: Normal heart sounds.  Pulmonary:     Effort: Pulmonary effort is normal.     Breath sounds: Normal breath sounds.  Abdominal:     General: There is no distension.     Palpations: Abdomen is soft.  Musculoskeletal:        General: Normal range of motion.     Cervical back: Normal range of motion.  Skin:    General: Skin is warm.  Neurological:     General: No focal deficit present.     Mental Status: She is alert and oriented to person, place, and time.     ED Results / Procedures / Treatments   Labs (all labs ordered are listed, but only abnormal results are displayed) Labs Reviewed  BASIC METABOLIC PANEL - Abnormal; Notable for the following components:      Result Value   Calcium 8.7 (*)    All other components within normal limits  RESP PANEL BY RT-PCR (RSV, FLU A&B, COVID)  RVPGX2  CBC  HCG, SERUM, QUALITATIVE  TROPONIN I (HIGH SENSITIVITY)  TROPONIN I (HIGH SENSITIVITY)    EKG None  Radiology DG Chest 2 View Result Date: 01/22/2023 CLINICAL DATA:  Chest pain. EXAM: CHEST - 2 VIEW COMPARISON:  February 14, 2021. FINDINGS: The heart size and mediastinal contours are within normal limits. Both lungs are clear. The visualized skeletal structures are unremarkable. IMPRESSION: No active cardiopulmonary disease. Electronically Signed   By: Lupita Raider M.D.   On: 01/22/2023 11:11    Procedures Procedures    Medications Ordered in ED Medications  albuterol (VENTOLIN HFA) 108 (90 Base) MCG/ACT inhaler 2 puff (2 puffs Inhalation Given 01/22/23 1132)    ED Course/ Medical Decision Making/ A&P                                 Medical Decision Making Patient complains of a cough and congestion.  Patient is concerned that she could have flu or COVID.  Amount and/or Complexity of Data Reviewed Labs: ordered.  Decision-making details documented in ED Course.    Details: COVID flu and RSV are negative Radiology: ordered and independent interpretation performed. Decision-making details documented in ED Course.    Details: Chest x-ray shows no evidence of pneumonia.  Risk Prescription drug management. Risk Details: Patient is given a prescription for albuterol.  Patient is given an albuterol inhaler.  Patient is given a note stating she can return to work.           Final Clinical Impression(s) / ED Diagnoses Final diagnoses:  Upper respiratory tract infection, unspecified type    Rx / DC Orders ED Discharge Orders          Ordered    albuterol (VENTOLIN HFA) 108 (90 Base) MCG/ACT inhaler  Every 6 hours PRN        01/22/23 1233          An After Visit Summary was printed and given to the patient.     Osie Cheeks 01/22/23 1233    Lorre Nick, MD 01/24/23 [phone]

## 2023-01-22 NOTE — ED Triage Notes (Signed)
Pt c/o chest pain, cough, headache and fever for past 4-5 days. Pt states she is feeling better overall but still feels like the chest pain is there. Pt has had nausea and shortness of breath.

## 2023-01-22 NOTE — ED Notes (Signed)
Pt coming from xray 

## 2023-04-24 ENCOUNTER — Other Ambulatory Visit: Payer: Self-pay

## 2023-04-24 ENCOUNTER — Emergency Department (HOSPITAL_COMMUNITY)
Admission: EM | Admit: 2023-04-24 | Discharge: 2023-04-25 | Discharge status: Against Medical Advice | Attending: Emergency Medicine | Admitting: Emergency Medicine

## 2023-04-24 ENCOUNTER — Emergency Department (HOSPITAL_COMMUNITY)

## 2023-04-24 ENCOUNTER — Encounter (HOSPITAL_COMMUNITY): Payer: Self-pay | Admitting: *Deleted

## 2023-04-24 DIAGNOSIS — M545 Low back pain, unspecified: Secondary | ICD-10-CM | POA: Diagnosis present

## 2023-04-24 DIAGNOSIS — Z5321 Procedure and treatment not carried out due to patient leaving prior to being seen by health care provider: Secondary | ICD-10-CM | POA: Diagnosis not present

## 2023-04-24 DIAGNOSIS — M25551 Pain in right hip: Secondary | ICD-10-CM | POA: Diagnosis not present

## 2023-04-24 LAB — PREGNANCY, URINE: Preg Test, Ur: NEGATIVE

## 2023-04-24 NOTE — ED Triage Notes (Signed)
 Pt is here for lower back pain not associated with any trauma or urinary symptoms x2 days.  Ambulatory to triage, MSE by PA prior to triage

## 2023-04-24 NOTE — ED Provider Triage Note (Signed)
 Emergency Medicine Provider Triage Evaluation Note  GRACELYNN BIRCHER , a 38 y.o. female  was evaluated in triage.  Pt complains of lower back pain and hip pain that has been ongoing for the past month.  Denies any injuries to the area.  Denies any paresthesias in the lower extremities.  Denies any weakness of the lower extremities.  Denies any bowel or bladder incontinence.  Review of Systems  Positive: As above Negative: As above  Physical Exam  BP (!) 144/94 (BP Location: Right Arm)   Pulse 70   Temp 98.9 F (37.2 C)   Resp 16   SpO2 100%  Gen:   Awake, no distress   Resp:  Normal effort MSK:   Moves extremities without difficulty.  Walks without difficulty.  5/5 strength of the bilateral lower extremities.  Intact sensation bilateral lower extremities  Medical Decision Making  Medically screening exam initiated at 2:56 PM.  Appropriate orders placed.  SEINI LANNOM was informed that the remainder of the evaluation will be completed by another provider, this initial triage assessment does not replace that evaluation, and the importance of remaining in the ED until their evaluation is complete.     Rexie Catena, PA-C 04/24/23 1457

## 2023-06-26 ENCOUNTER — Emergency Department (HOSPITAL_COMMUNITY)

## 2023-06-26 ENCOUNTER — Other Ambulatory Visit: Payer: Self-pay

## 2023-06-26 ENCOUNTER — Emergency Department (HOSPITAL_COMMUNITY)
Admission: EM | Admit: 2023-06-26 | Discharge: 2023-06-26 | Disposition: A | Discharge status: Home / Self Care | Attending: Emergency Medicine | Admitting: Emergency Medicine

## 2023-06-26 ENCOUNTER — Encounter (HOSPITAL_COMMUNITY): Payer: Self-pay

## 2023-06-26 DIAGNOSIS — R079 Chest pain, unspecified: Secondary | ICD-10-CM | POA: Insufficient documentation

## 2023-06-26 DIAGNOSIS — Z5900 Homelessness unspecified: Secondary | ICD-10-CM | POA: Diagnosis not present

## 2023-06-26 DIAGNOSIS — F17210 Nicotine dependence, cigarettes, uncomplicated: Secondary | ICD-10-CM | POA: Diagnosis not present

## 2023-06-26 LAB — CBC WITH DIFFERENTIAL/PLATELET
Abs Immature Granulocytes: 0.01 10*3/uL (ref 0.00–0.07)
Basophils Absolute: 0 10*3/uL (ref 0.0–0.1)
Basophils Relative: 0 %
Eosinophils Absolute: 0.3 10*3/uL (ref 0.0–0.5)
Eosinophils Relative: 6 %
HCT: 40.4 % (ref 36.0–46.0)
Hemoglobin: 12.7 g/dL (ref 12.0–15.0)
Immature Granulocytes: 0 %
Lymphocytes Relative: 42 %
Lymphs Abs: 2.3 10*3/uL (ref 0.7–4.0)
MCH: 28.2 pg (ref 26.0–34.0)
MCHC: 31.4 g/dL (ref 30.0–36.0)
MCV: 89.6 fL (ref 80.0–100.0)
Monocytes Absolute: 0.6 10*3/uL (ref 0.1–1.0)
Monocytes Relative: 11 %
Neutro Abs: 2.2 10*3/uL (ref 1.7–7.7)
Neutrophils Relative %: 41 %
Platelets: 277 10*3/uL (ref 150–400)
RBC: 4.51 MIL/uL (ref 3.87–5.11)
RDW: 13.6 % (ref 11.5–15.5)
WBC: 5.4 10*3/uL (ref 4.0–10.5)
nRBC: 0 % (ref 0.0–0.2)

## 2023-06-26 LAB — BASIC METABOLIC PANEL WITH GFR
Anion gap: 8 (ref 5–15)
BUN: 11 mg/dL (ref 6–20)
CO2: 21 mmol/L — ABNORMAL LOW (ref 22–32)
Calcium: 8.4 mg/dL — ABNORMAL LOW (ref 8.9–10.3)
Chloride: 112 mmol/L — ABNORMAL HIGH (ref 98–111)
Creatinine, Ser: 0.85 mg/dL (ref 0.44–1.00)
GFR, Estimated: >60 mL/min (ref >60)
Glucose, Bld: 87 mg/dL (ref 70–99)
Potassium: 3.9 mmol/L (ref 3.5–5.1)
Sodium: 141 mmol/L (ref 135–145)

## 2023-06-26 LAB — TROPONIN I (HIGH SENSITIVITY)
Troponin I (High Sensitivity): 5 ng/L (ref <18)
Troponin I (High Sensitivity): 6 ng/L (ref <18)

## 2023-06-26 LAB — MAGNESIUM: Magnesium: 1.8 mg/dL (ref 1.7–2.4)

## 2023-06-26 MED ORDER — VENTOLIN HFA 108 (90 BASE) MCG/ACT IN AERS: 1.0000 | INHALATION_SPRAY | RESPIRATORY_TRACT | 0 refills | Status: AC | PRN

## 2023-06-26 NOTE — ED Provider Notes (Signed)
 [place] EMERGENCY DEPARTMENT AT [facility] Provider Note  CSN: [id number] Arrival date & time: 06/26/23 1417  Chief Complaint(s) Chest Pain  HPI Lisa Blevins is a 38 y.o. female with past medical history as below, significant for obesity, homeless who presents to the ED with complaint of chest pain.  Patient reports began having left-sided chest pain yesterday evening, sharp and stabbing sensation, intermittent, provoked by arm movement or torso movement.  No palpitations, heart racing, difficulty breathing, syncope or near syncope, no trauma.  Denies similar symptoms in the past, denies history of prior cardiac disease.  She is currently symptomatic  Past Medical History Past Medical History:  Diagnosis Date   BMI 30.0-30.9,adult    Homeless    Patient Active Problem List   Diagnosis Date Noted   BMI 30.0-30.9,adult    Homeless    Home Medication(s) Prior to Admission medications   Medication Sig Start Date End Date Taking? Authorizing Provider  albuterol  (VENTOLIN  HFA) 108 (90 Base) MCG/ACT inhaler Inhale 1-2 puffs into the lungs every 4 (four) hours as needed for wheezing or shortness of breath. 06/26/23  Yes Elnor Savant A, DO  albuterol  (VENTOLIN  HFA) 108 (90 Base) MCG/ACT inhaler Inhale 2 puffs into the lungs every 6 (six) hours as needed for wheezing or shortness of breath. 01/22/23   Sofia, Leslie K, PA-C  cyclobenzaprine  (FLEXERIL ) 10 MG tablet Take 1 tablet (10 mg total) by mouth 2 (two) times daily as needed for muscle spasms. 05/18/21   Conklin, Erica R, PA-C  ibuprofen  (ADVIL ) 600 MG tablet Take 1 tablet (600 mg total) by mouth every 6 (six) hours as needed. 04/20/22   Robinson, John K, PA-C  traMADol (ULTRAM) 50 MG tablet Take 50 mg by mouth every 6 (six) hours as needed for moderate pain.    [provider]                                                                                                                                    Past  Surgical History Past Surgical History:  Procedure Laterality Date   arm surgery     Family History History reviewed. No pertinent family history.  Social History Social History   Tobacco Use   Smoking status: Some Days    Current packs/day: 0.50    Types: Cigarettes   Smokeless tobacco: Never  Vaping Use   Vaping status: Never Used  Substance Use Topics   Alcohol use: Yes   Drug use: Yes    Types: Marijuana   Allergies Patient has no known allergies.  Review of Systems A thorough review of systems was obtained and all systems are negative except as noted in the HPI and PMH.   Physical Exam Vital Signs  I have reviewed the triage vital signs BP 130/67   Pulse 72   Temp 98.1 F (36.7 C) (Oral)   Resp 15   Ht  5' 11 (1.803 m)   Wt 99.8 kg   SpO2 100%   BMI 30.68 kg/m  Physical Exam Vitals and nursing note reviewed.  Constitutional:      General: She is not in acute distress.    Appearance: Normal appearance. She is obese.  HENT:     Head: Normocephalic and atraumatic.     Right Ear: External ear normal.     Left Ear: External ear normal.     Nose: Nose normal.     Mouth/Throat:     Mouth: Mucous membranes are moist.   Eyes:     General: No scleral icterus.       Right eye: No discharge.        Left eye: No discharge.    Cardiovascular:     Rate and Rhythm: Normal rate and regular rhythm.     Pulses: Normal pulses.     Heart sounds: Normal heart sounds.  Pulmonary:     Effort: Pulmonary effort is normal. No respiratory distress.     Breath sounds: Normal breath sounds. No stridor. No wheezing.  Chest:   Abdominal:     General: Abdomen is flat. There is no distension.     Palpations: Abdomen is soft.     Tenderness: There is no abdominal tenderness.   Musculoskeletal:     Cervical back: No rigidity.     Right lower leg: No edema.     Left lower leg: No edema.   Skin:    General: Skin is warm and dry.     Capillary Refill: Capillary refill  takes less than 2 seconds.   Neurological:     Mental Status: She is alert.   Psychiatric:        Mood and Affect: Mood normal.        Behavior: Behavior normal. Behavior is cooperative.     ED Results and Treatments Labs (all labs ordered are listed, but only abnormal results are displayed) Labs Reviewed  BASIC METABOLIC PANEL WITH GFR - Abnormal; Notable for the following components:      Result Value   Chloride 112 (*)    CO2 21 (*)    Calcium 8.4 (*)    All other components within normal limits  CBC WITH DIFFERENTIAL/PLATELET  MAGNESIUM  TROPONIN I (HIGH SENSITIVITY)  TROPONIN I (HIGH SENSITIVITY)                                                                                                                          Radiology DG Chest 1 View Result Date: 06/26/2023 CLINICAL DATA:  Chest pain EXAM: CHEST  1 VIEW COMPARISON:  Chest x-ray 01/22/2023 FINDINGS: The heart size and mediastinal contours are within normal limits. Both lungs are clear. The visualized skeletal structures are unremarkable. IMPRESSION: No active disease. Electronically Signed   By: Greig Pique M.D.   On: 06/26/2023 21:29    Pertinent labs & imaging results that were available during my care  of the patient were reviewed by me and considered in my medical decision making (see MDM for details).  Medications Ordered in ED Medications - No data to display                                                                                                                                   Procedures Procedures  (including critical care time)  Medical Decision Making / ED Course    Medical Decision Making:    Lisa Blevins is a 38 y.o. female with past medical history as below, significant for obesity, homeless who presents to the ED with complaint of chest pain. . The complaint involves an extensive differential diagnosis and also carries with it a high risk of complications and morbidity.  Serious  etiology was considered. Ddx includes but is not limited to: Differential includes all life-threatening causes for chest pain. This includes but is not exclusive to acute coronary syndrome, aortic dissection, pulmonary embolism, cardiac tamponade, community-acquired pneumonia, pericarditis, musculoskeletal chest wall pain, etc.   Complete initial physical exam performed, notably the patient was in no distress, asymptomatic.    Reviewed and confirmed nursing documentation for past medical history, family history, social history.  Vital signs reviewed.     Brief summary:  38 year old female history as above here with chest pain.  Currently asymptomatic   Clinical Course as of 06/26/23 2202  Tue Jun 26, 2023  2139 Trop neg x2, EKG non-ischemic [SG]    Clinical Course User Index [SG] Elnor Jayson LABOR, DO   PERC negative  Encourage patient to abstain from vaping  The patient's chest pain is not suggestive of pulmonary embolus, cardiac ischemia, aortic dissection, pericarditis, myocarditis, pulmonary embolism, pneumothorax, pneumonia, Zoster, or esophageal perforation, or other serious etiology.  Historically not abrupt in onset, tearing or ripping, pulses symmetric. EKG nonspecific for ischemia/infarction. No dysrhythmias, brugada, WPW, prolonged QT noted.  Chest pain is reproducible.  Troponin negative x2. CXR reviewed. Labs without demonstration of acute pathology unless otherwise noted above. Low HEART Score: 0-3 points (0.9-1.7% risk of MACE).  Given the extremely low risk of these diagnoses further testing and evaluation for these possibilities does not appear to be indicated at this time. Patient in no distress and overall condition improved here in the ED. Detailed discussions were had with the patient regarding current findings, and need for close f/u with PCP or on call doctor. The patient has been instructed to return immediately if the symptoms worsen in any way for re-evaluation.  Patient verbalized understanding and is in agreement with current care plan. All questions answered prior to discharge.              Additional history obtained: -Additional history obtained from na -External records from outside source obtained and reviewed including: Chart review including previous notes, labs, imaging, consultation notes including  Prior ER evaluation, prior labs and imaging   Lab Tests: -I  ordered, reviewed, and interpreted labs.   The pertinent results include:   Labs Reviewed  BASIC METABOLIC PANEL WITH GFR - Abnormal; Notable for the following components:      Result Value   Chloride 112 (*)    CO2 21 (*)    Calcium 8.4 (*)    All other components within normal limits  CBC WITH DIFFERENTIAL/PLATELET  MAGNESIUM  TROPONIN I (HIGH SENSITIVITY)  TROPONIN I (HIGH SENSITIVITY)    Notable for labs are stable  EKG   EKG Interpretation Date/Time:  Tuesday June 26 2023 14:26:06 EDT Ventricular Rate:  88 PR Interval:  124 QRS Duration:  80 QT Interval:  390 QTC Calculation: 471 R Axis:   62  Text Interpretation: Normal sinus rhythm Right atrial enlargement Nonspecific ST and T wave abnormality Abnormal ECG When compared with ECG of 22-Jan-2023 10:20, No significant change since last tracing Confirmed by Towana Sharper [phone]) on 06/26/2023 2:30:34 PM         Imaging Studies ordered: I ordered imaging studies including this x-ray I independently visualized the following imaging with scope of interpretation limited to determining acute life threatening conditions related to emergency care; findings noted above I agree with the radiologist interpretation If any imaging was obtained with contrast I closely monitored patient for any possible adverse reaction a/w contrast administration in the emergency department   Medicines ordered and prescription drug management: Meds ordered this encounter  Medications   albuterol  (VENTOLIN  HFA) 108 (90  Base) MCG/ACT inhaler    Sig: Inhale 1-2 puffs into the lungs every 4 (four) hours as needed for wheezing or shortness of breath.    Dispense:  18 g    Refill:  0    -I have reviewed the patients home medicines and have made adjustments as needed   Consultations Obtained: Not applicable  Cardiac Monitoring: Continuous pulse oximetry interpreted by myself, 100% on RA.    Social Determinants of Health:  Diagnosis or treatment significantly limited by social determinants of health: obesity and homelessness Counseled patient for approximately 3 minutes regarding smoking cessation. Discussed risks of smoking and how they applied and affected their visit here today. Patient not ready to quit at this time, however will follow up with their primary doctor when they are.   CPT code: 00593: intermediate counseling for smoking cessation     Reevaluation: After the interventions noted above, I reevaluated the patient and found that they have resolved  Co morbidities that complicate the patient evaluation  Past Medical History:  Diagnosis Date   BMI 30.0-30.9,adult    Homeless       Dispostion: Disposition decision including need for hospitalization was considered, and patient discharged from emergency department.    Final Clinical Impression(s) / ED Diagnoses Final diagnoses:  Chest pain with low risk of acute coronary syndrome        Elnor Jayson LABOR, DO 06/26/23 2202

## 2023-06-26 NOTE — ED Notes (Addendum)
 Patient transported to X-ray

## 2023-06-26 NOTE — ED Triage Notes (Signed)
 Patient complains of left sided chest pain that started last night and goes down her left arm.  Denies all other symptoms such as sob nausea dizziness.

## 2023-06-26 NOTE — Discharge Instructions (Addendum)
 Return to the Emergency Department if you have unusual chest pain, pressure, or discomfort, shortness of breath, nausea, vomiting, burping, heartburn, tingling upper body parts, sweating, cold, clammy skin, or racing heartbeat. Call 911 if you think you are having a heart attack. Take all cardiac medications as prescribed - notify your doctor if you have any side effects. Follow cardiac diet - avoid fatty & fried foods, don't eat too much red meat, eat lots of fruits & vegetables, and dairy products should be low fat. Please lose weight if you are overweight. Become more active with walking, gardening, or any other activity that gets you to moving.   Please return to the emergency department immediately for any new or concerning symptoms, or if you get worse.    RESOURCE GUIDE  Chronic Pain Problems: Contact [facility]  [phone] Patients need to be referred by their primary care doctor.  Insufficient Money for Medicine: Contact [employer]:  call [phone]  No Primary Care Doctor: Call [facility]  [phone] - can help you locate a primary care doctor that  accepts your insurance, provides certain services, etc. Physician Referral Service- [phone]  Agencies that provide inexpensive medical care: [facility]  [phone] [facility]  [phone] [facility]  [phone] [facility]  [phone] [facility]  [phone] [facility]  [phone]  Medicaid-accepting [facility] Providers: [facility]- [address], [address]  [phone], Mon-Fri 9am-7pm, Sat 9am-1pm [facility]- [address] [place], [address] [place]  [phone] [facility]- [address], [address] [place]  [phone] [facility]- [address]  [phone] [facility]- [address] [place], [address], [phone]  Only accepts [place] patients after they  have their name  applied to their card  Self Pay (no insurance) in [facility]: Sickle Cell Patients - [facility]  [address] [place], [phone] [facility]- [address] [place]  [phone]       [place] [facility] [place]- [address]       -     [facility]- see information above (Speak to [employer] if you do not have insurance)       -  [facility]- [address],  [phone]       -  [facility]- [address] [phone]       -  [facility]-  [address], [address], [place], [phone]       -  [facility] - [address] [phone]       -  [facility]- [address], [phone], also [address] [phone]       -     [facility]- [address] [place], [phone], 1st & 3rd Saturday         every month, 10am-1pm  -     [facility]   [address] [place], [place].   Phone:  [phone], Fax:  [phone]. Hours of Operation:  9 am - 6 pm, M-F.  -     [facility]   [address], [place]   Phone: [phone], Fax: [phone]. Hours of Operation:  8:30 am - 5:30 pm, M-F.  Dental Assistance If unable to pay or uninsured, contact:  [facility]. to become qualified for the adult dental clinic.  Patients with Medicaid: [facility] [phone] [address], [phone] [address], [phone]  If unable to pay, or uninsured, contact [facility] [phone] in [place], [phone] in [facility]) to become qualified for the adult dental clinic  [facility] [address] [place], [place] [address] [phone] [email or web address]  Other Proofreader Services: [facility]- [address] [place], [place], [place], [address], [phone], Ext. [phone], 2nd and 4th Thursday of the month at 6:30am.   10 clients each day by appointment, can sometimes see walk-in patients if someone does not show for an appointment. [facility]- [address] [place], [place], [address], [phone] [facility] [address] [place], [place], [address], [phone] [facility] Health Department- [phone] [facility] Health Department- [phone] [facility] [phone]

## 2023-06-26 NOTE — ED Provider Triage Note (Signed)
 Emergency Medicine Provider Triage Evaluation Note  Lisa Blevins , a 38 y.o. female  was evaluated in triage.  Pt complains of chest pain that started last night.  States it radiates down left arm.  Denies history of CAD.SABRA  Review of Systems  Positive: As above Negative: As above  Physical Exam  BP 109/64 (BP Location: Right Arm)   Pulse 84   Temp 98.2 F (36.8 C) (Oral)   Resp 16   Ht 5' 11 (1.803 m)   Wt 99.8 kg   SpO2 99%   BMI 30.68 kg/m  Gen:   Awake, no distress   Resp:  Normal effort  MSK:   Moves extremities without difficulty  Other:    Medical Decision Making  Medically screening exam initiated at 2:38 PM.  Appropriate orders placed.  Lisa Blevins was informed that the remainder of the evaluation will be completed by another provider, this initial triage assessment does not replace that evaluation, and the importance of remaining in the ED until their evaluation is complete.     Lisa Loge, PA-C 06/26/23 1439

## 2023-10-12 IMAGING — CR DG THORACIC SPINE 2V
3 series · 3 of 3 positions shown · non-contrast
Comparison: Chest x-ray dated February 14, 2021.

CLINICAL DATA: Chronic back pain.

EXAM:
THORACIC SPINE 2 VIEWS

[t-spine ap]
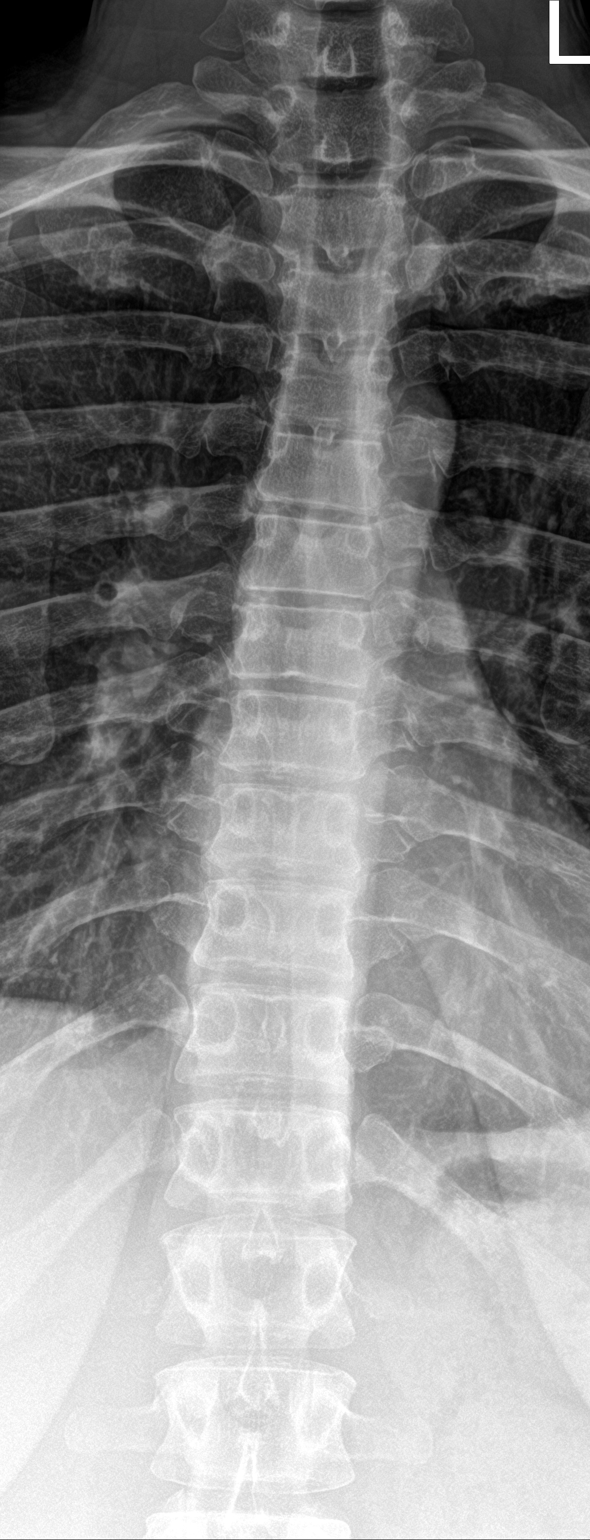

[t-spine lat]
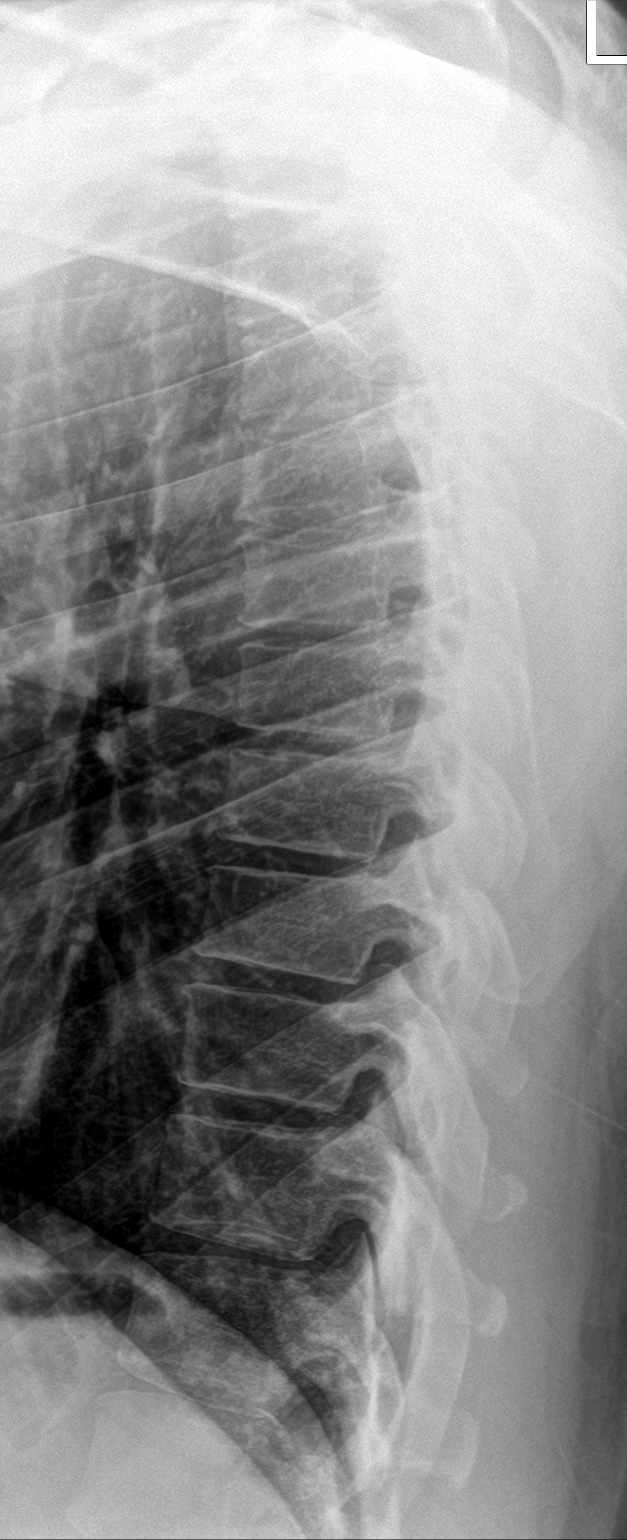

[t-spine swimmers]
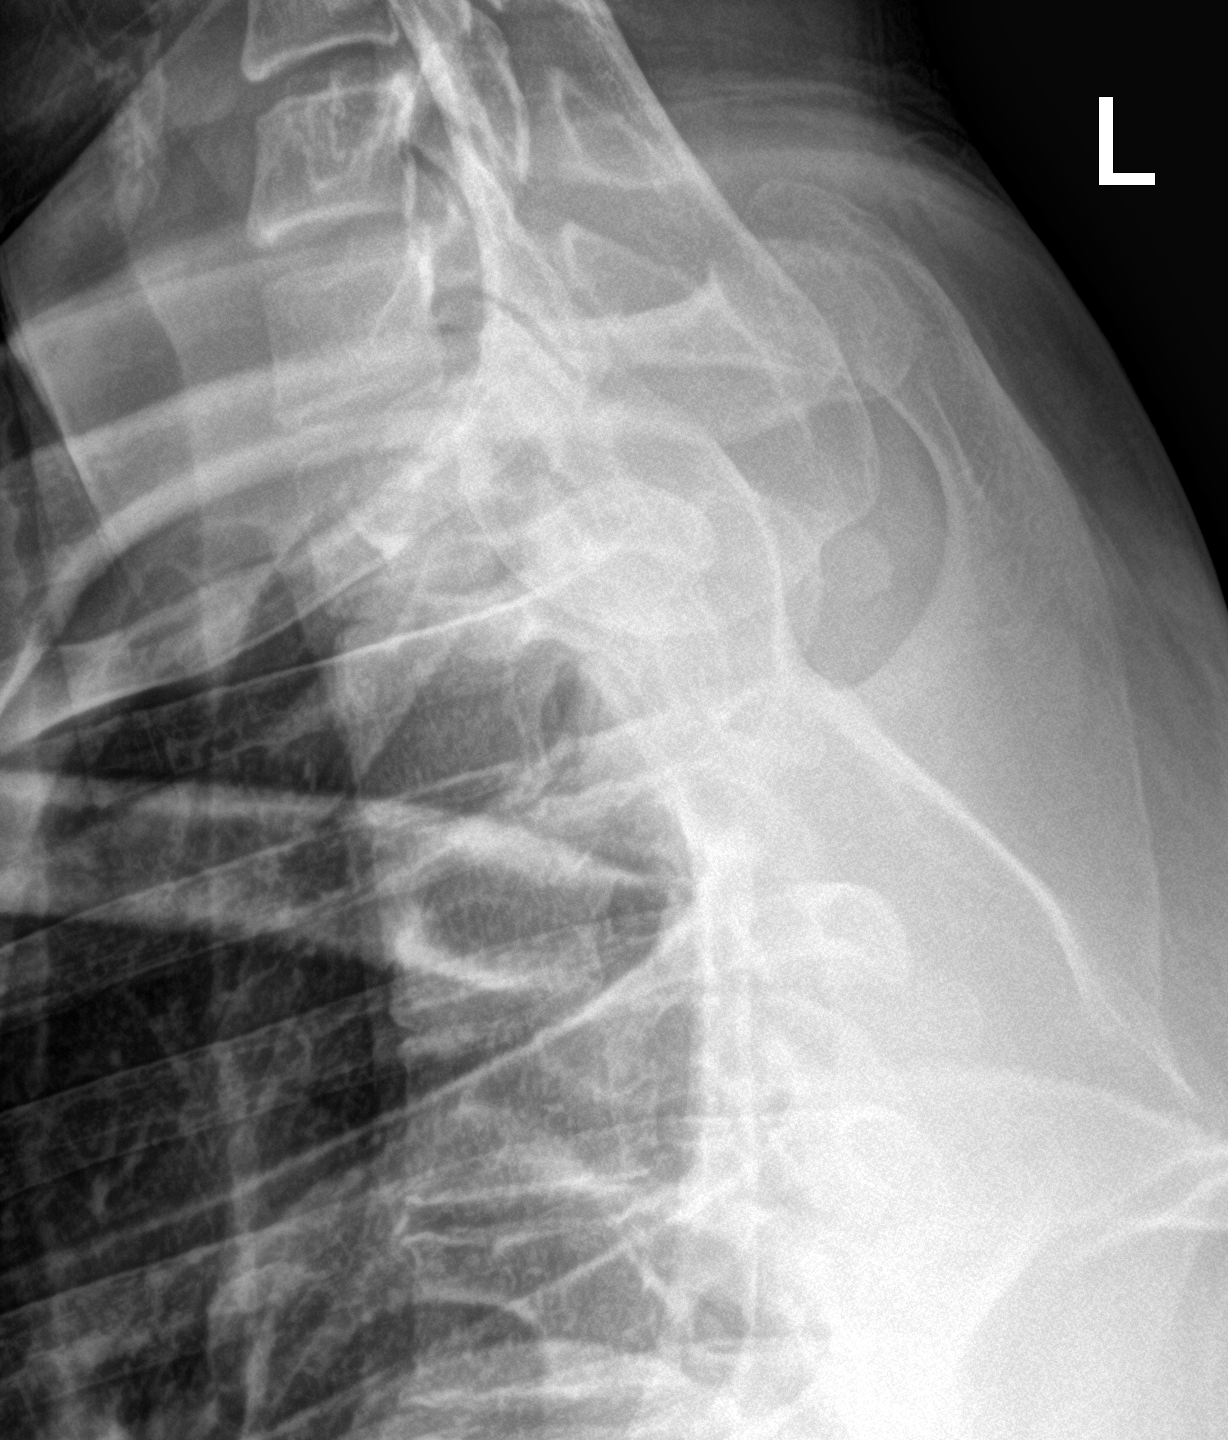

[3 of 3 positions shown; findings below may reference images not displayed]

FINDINGS: Twelve rib-bearing thoracic vertebral bodies.

No acute fracture or subluxation. Vertebral body heights are
preserved.

Alignment is normal. Intervertebral disc spaces are maintained.
IMPRESSION: Negative.

## 2023-10-12 IMAGING — CR DG LUMBAR SPINE COMPLETE 4+V
5 series · 5 of 5 positions shown · non-contrast
Comparison: CT lumbar spine 10/28/2020.  Radiographs 10/27/2020.

CLINICAL DATA: Upper back pain radiating to the lower back for 1
year. Increasing pain over the last several days. No previous
relevant surgery.

EXAM:
LUMBAR SPINE - COMPLETE 4+ VIEW

[l-spine ap]
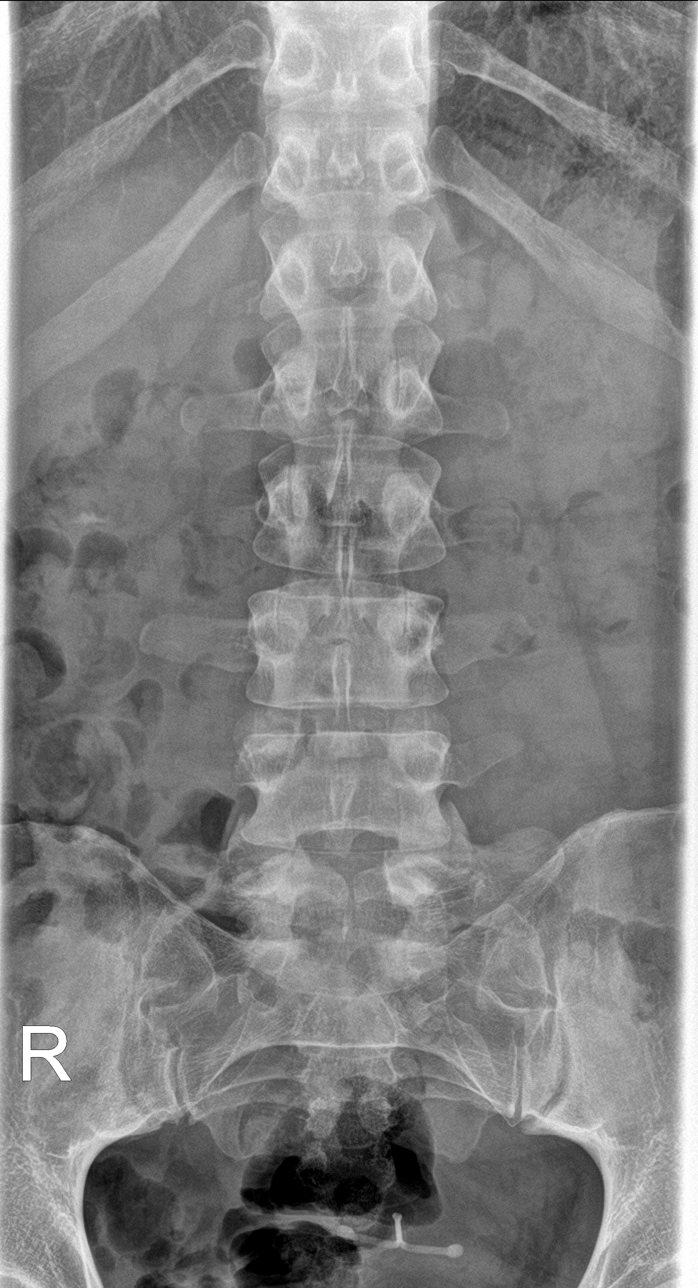

[l-spine obl (1 of 2)]
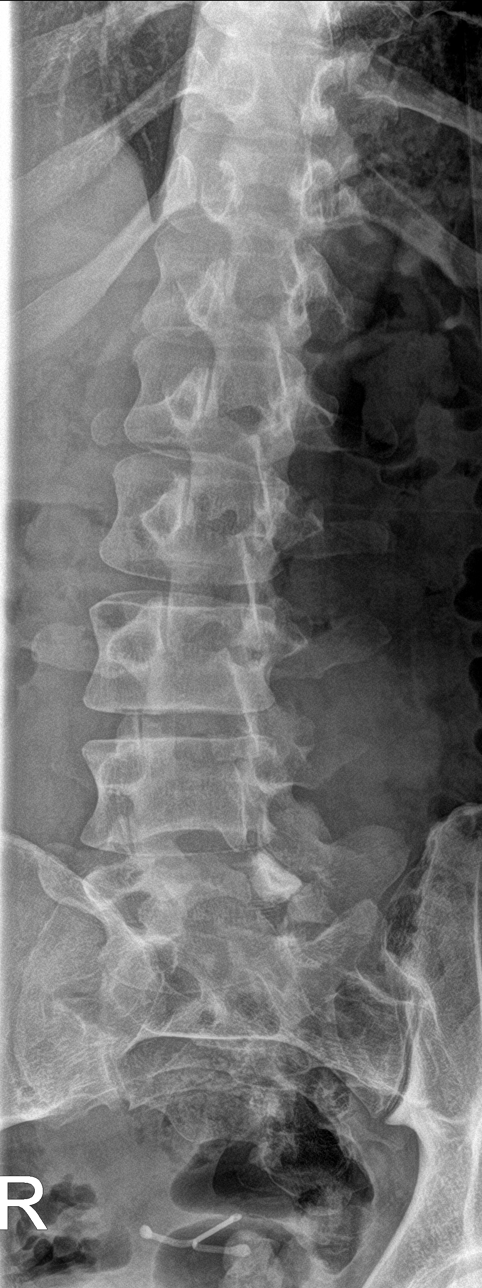

[l-spine obl (2 of 2)]
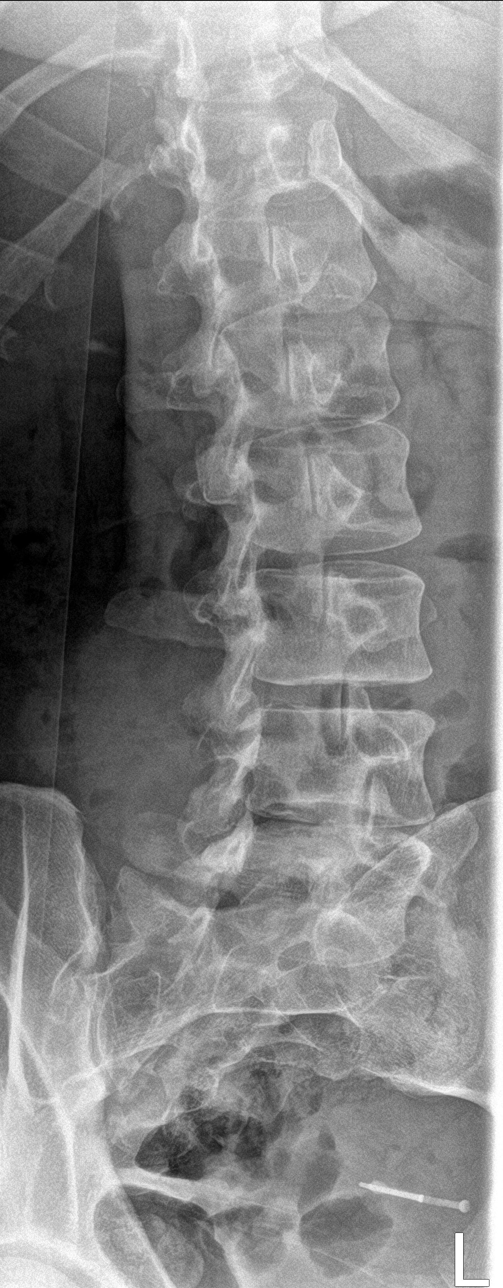

[l-spine lat]
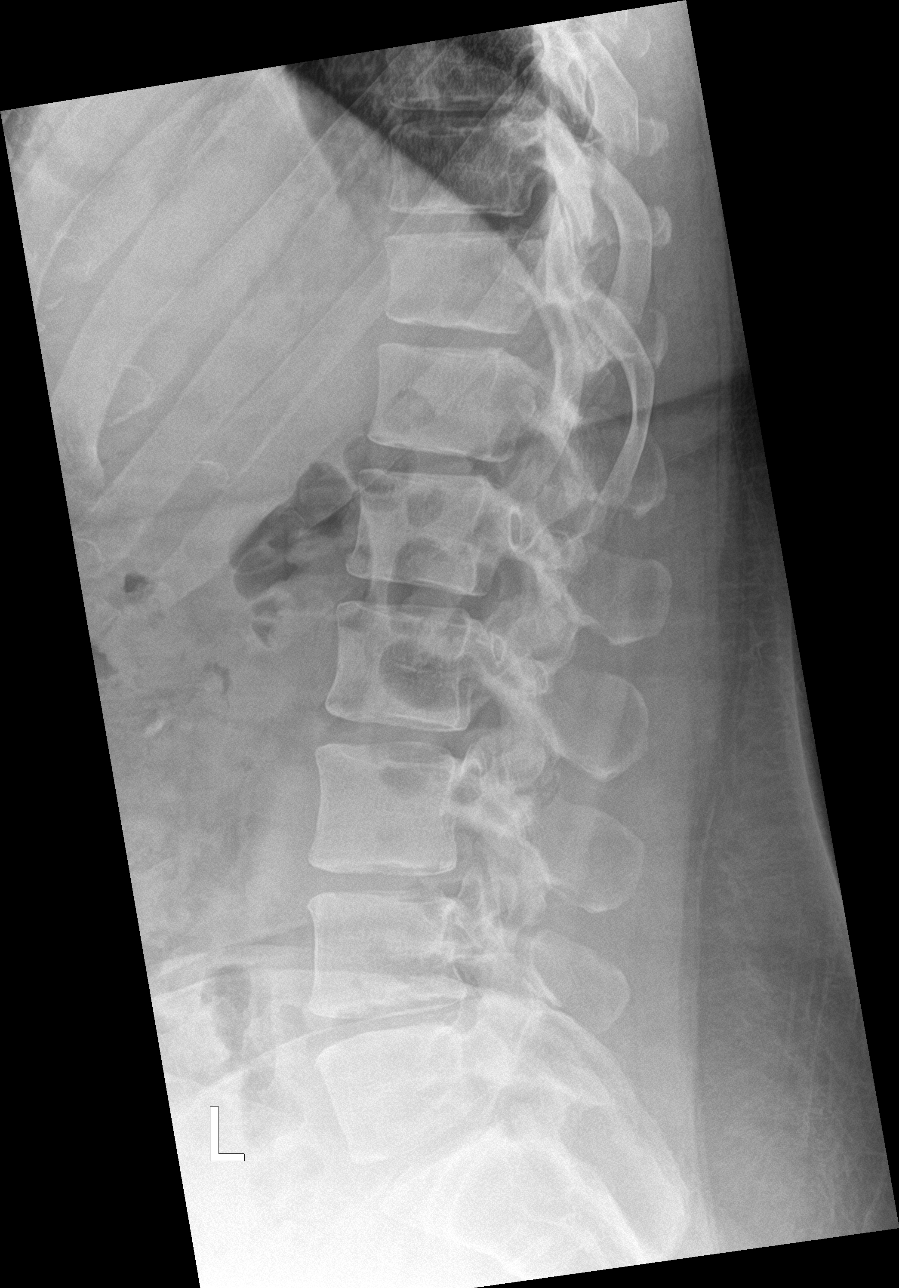

[l-spine spot]
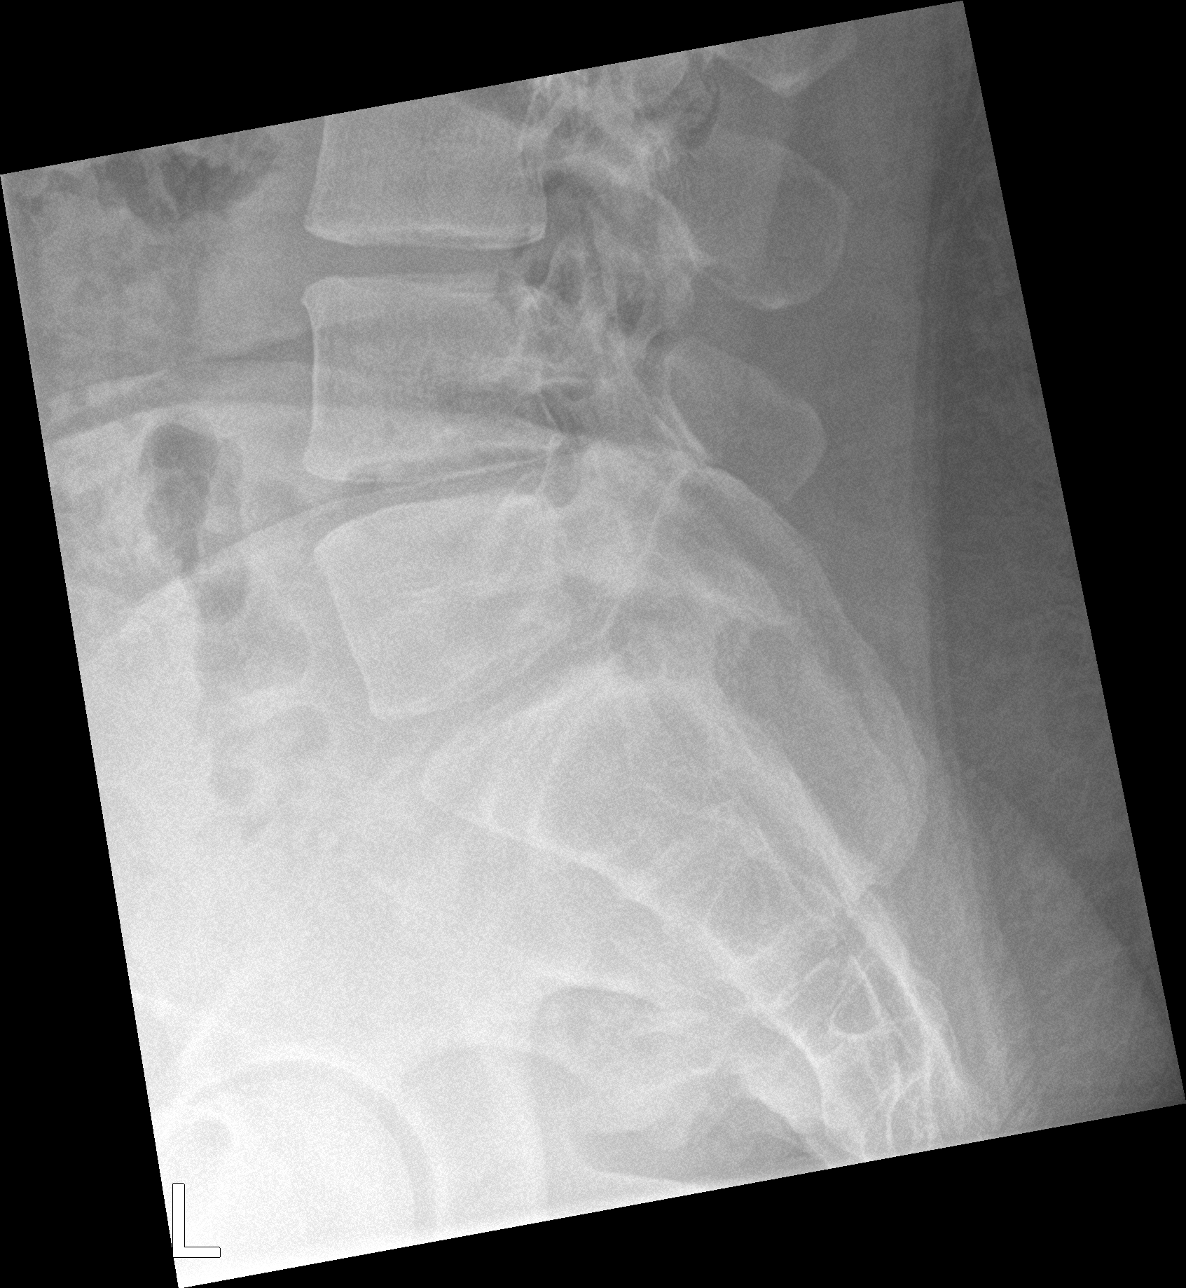

[5 of 5 positions shown; findings below may reference images not displayed]

FINDINGS: Transitional lumbosacral anatomy. In correlation with thoracic spine
radiographs done today, there are 12 rib-bearing thoracic type
vertebral bodies and a transitional, largely lumbarized S1 segment.
The alignment is normal. The disc spaces remain largely preserved.
There is no evidence of acute fracture or pars defect. Mild facet
degenerative changes inferiorly. Intrauterine device noted.
IMPRESSION: Stable lumbar spine radiographs, without acute findings or
malalignment. Transitional lumbosacral anatomy.
